# Patient Record
Sex: Male | Born: 1985 | Race: White | Hispanic: No | Marital: Married | Smoking: Former smoker
Health system: Southern US, Community
[De-identification: ages and names within clinical notes are randomized; demographics above are authoritative.]

## PROBLEM LIST (undated history)

## (undated) DIAGNOSIS — G43909 Migraine, unspecified, not intractable, without status migrainosus: Secondary | ICD-10-CM

## (undated) HISTORY — DX: Migraine, unspecified, not intractable, without status migrainosus: G43.909

## (undated) HISTORY — PX: OTHER SURGICAL HISTORY: SHX169

---

## 2016-06-28 DIAGNOSIS — Z720 Tobacco use: Secondary | ICD-10-CM | POA: Insufficient documentation

## 2016-06-28 DIAGNOSIS — F909 Attention-deficit hyperactivity disorder, unspecified type: Secondary | ICD-10-CM | POA: Insufficient documentation

## 2017-12-24 ENCOUNTER — Encounter: Payer: Self-pay | Admitting: Family Medicine

## 2017-12-24 ENCOUNTER — Ambulatory Visit (INDEPENDENT_AMBULATORY_CARE_PROVIDER_SITE_OTHER): Payer: BLUE CROSS/BLUE SHIELD | Admitting: Family Medicine

## 2017-12-24 DIAGNOSIS — F988 Other specified behavioral and emotional disorders with onset usually occurring in childhood and adolescence: Secondary | ICD-10-CM | POA: Diagnosis not present

## 2017-12-24 DIAGNOSIS — Z716 Tobacco abuse counseling: Secondary | ICD-10-CM

## 2017-12-24 NOTE — Progress Notes (Signed)
Subjective:    Patient ID: Russell Gutierrez, male    DOB: [birth year], 32 y.o.   MRN: [id number]  HPI  Patient presents to clinic to establish with new PCP.  Currently he takes Adderall 20 mg/day for ADD.  States he has used Adderall off and on for many years starting in his early teens to help with concentration.  States he usually does not take Adderall on the weekends, mainly uses it while at work.  He would like to consider decreasing his dose and/or eventually stopping Adderall to see how he does.  Currently he is taking Chantix 1 mg twice a day for smoking cessation.  He has been on Chantix now for a total of 12 weeks and has not been smoking any cigarettes recently.  He used Chantix a few years back, but stopped medication too soon and went back to smoking.  He would like to stay on Chantix for at least another 3 months to be sure its effects are long-lasting.  Patient is married and has a 156-week-old daughter named Eleni.  Reports no significant family Hx  Reports migraines in the past, but not any issues with headache in recent years.    Review of Systems  Constitutional: Negative for chills, fatigue and fever.  HENT: Negative for congestion, ear pain, sinus pain and sore throat.   Eyes: Negative.   Respiratory: Negative for cough, shortness of breath and wheezing.   Cardiovascular: Negative for chest pain, palpitations and leg swelling.  Gastrointestinal: Negative for abdominal pain, diarrhea, nausea and vomiting.  Genitourinary: Negative for dysuria, frequency and urgency.  Musculoskeletal: Negative for arthralgias and myalgias.  Skin: Negative for color change, pallor and rash.  Neurological: Negative for syncope, light-headedness and headaches.  Psychiatric/Behavioral: The patient is not nervous/anxious.       Objective:   Physical Exam  Constitutional: He is oriented to person, place, and time. He appears well-developed and well-nourished. No distress.  HENT:  Head:  Normocephalic and atraumatic.  Cardiovascular: Normal rate, regular rhythm and normal heart sounds.  No murmur heard. Pulmonary/Chest: Effort normal and breath sounds normal. No respiratory distress.  Neurological: He is alert and oriented to person, place, and time.  Skin: Skin is warm and dry. No pallor.  Psychiatric: He has a normal mood and affect. His behavior is normal. Thought content normal.  Nursing note and vitals reviewed.  Vitals:   12/24/17 0842  BP: 128/86  Pulse: 60  Temp: (!) 97.5 F (36.4 C)  SpO2: 98%      Assessment & Plan:   ADD - patient advised he can decrease Adderall dose to 10 mg/day, and he may continue to use at work as he has been doing and not use on weekends.  Patient advised that if Adderall helps him concentrate and feel productive at his job, there is no reason he has to come off completely.  A 10 or 20 mg dose is not a high dose, and he already does drug holidays on the weekends.  Patient advised that if he would like to see how he does without taking Adderall he is more than welcome to try this.  However more than willing to continue to prescribe him Adderall, so he can be productive at his job and in his life.  Smoking cessation -patient is doing very well on Chantix.  He will continue 1 mg twice daily dosing at this time.  We will keep patient on Chantix for a total goal of 6 months.  Follow up in 3-4 weeks for CPE

## 2017-12-24 NOTE — Patient Instructions (Signed)
Great to meet you! 

## 2018-01-08 ENCOUNTER — Telehealth: Payer: Self-pay | Admitting: Family Medicine

## 2018-01-08 DIAGNOSIS — F988 Other specified behavioral and emotional disorders with onset usually occurring in childhood and adolescence: Secondary | ICD-10-CM

## 2018-01-08 NOTE — Telephone Encounter (Signed)
Refill of adderall by historical provider  LOV 12/24/17 L. Guse   [id number] [address], [place] [phone]

## 2018-01-08 NOTE — Telephone Encounter (Signed)
Copied from CRM [id number]. Topic: Quick Communication - Rx Refill/Question >> Jan 08, 2018  4:46 PM Mickel Baas B, NT wrote: Medication: amphetamine-dextroamphetamine (ADDERALL) 10 MG tablet   Has the patient contacted their pharmacy? No. (Agent: If no, request that the patient contact the pharmacy for the refill.) (Agent: If yes, when and what did the pharmacy advise?)  Preferred Pharmacy (with phone number or street name): [id number] [place], [place] - [address]  Agent: Please be advised that RX refills may take up to 3 business days. We ask that you follow-up with your pharmacy.

## 2018-01-08 NOTE — Telephone Encounter (Signed)
rx request for adderall.

## 2018-01-09 MED ORDER — AMPHETAMINE-DEXTROAMPHETAMINE 10 MG PO TABS: 10.0000 mg | ORAL_TABLET | Freq: Two times a day (BID) | ORAL | 0 refills | Status: DC

## 2018-01-21 ENCOUNTER — Other Ambulatory Visit: Payer: Self-pay

## 2018-01-21 ENCOUNTER — Encounter: Payer: Self-pay | Admitting: Family Medicine

## 2018-01-21 ENCOUNTER — Ambulatory Visit (INDEPENDENT_AMBULATORY_CARE_PROVIDER_SITE_OTHER): Payer: BLUE CROSS/BLUE SHIELD | Admitting: Family Medicine

## 2018-01-21 VITALS — BP 114/76 | HR 61 | Temp 98.0°F | Wt 144.0 lb

## 2018-01-21 DIAGNOSIS — Z87891 Personal history of nicotine dependence: Secondary | ICD-10-CM | POA: Diagnosis not present

## 2018-01-21 DIAGNOSIS — Z8349 Family history of other endocrine, nutritional and metabolic diseases: Secondary | ICD-10-CM

## 2018-01-21 DIAGNOSIS — Z0000 Encounter for general adult medical examination without abnormal findings: Secondary | ICD-10-CM

## 2018-01-21 DIAGNOSIS — Z716 Tobacco abuse counseling: Secondary | ICD-10-CM | POA: Diagnosis not present

## 2018-01-21 DIAGNOSIS — R17 Unspecified jaundice: Secondary | ICD-10-CM

## 2018-01-21 DIAGNOSIS — F988 Other specified behavioral and emotional disorders with onset usually occurring in childhood and adolescence: Secondary | ICD-10-CM | POA: Diagnosis not present

## 2018-01-21 DIAGNOSIS — Z23 Encounter for immunization: Secondary | ICD-10-CM

## 2018-01-21 LAB — THYROID PANEL WITH TSH
FREE THYROXINE INDEX: 2.7 (ref 1.4–3.8)
T3 Uptake: 33 % (ref 22–35)
T4 TOTAL: 8.2 ug/dL (ref 4.9–10.5)
TSH: 2.12 mIU/L (ref 0.40–4.50)

## 2018-01-21 LAB — COMPREHENSIVE METABOLIC PANEL
ALT: 13 U/L (ref 0–53)
AST: 16 U/L (ref 0–37)
Albumin: 4.8 g/dL (ref 3.5–5.2)
Alkaline Phosphatase: 42 U/L (ref 39–117)
BILIRUBIN TOTAL: 3 mg/dL — AB (ref 0.2–1.2)
BUN: 21 mg/dL (ref 6–23)
CO2: 31 meq/L (ref 19–32)
CREATININE: 0.85 mg/dL (ref 0.40–1.50)
Calcium: 10 mg/dL (ref 8.4–10.5)
Chloride: 101 mEq/L (ref 96–112)
GFR: 110.17 mL/min (ref >60.00)
GLUCOSE: 85 mg/dL (ref 70–99)
Potassium: 3.8 mEq/L (ref 3.5–5.1)
Sodium: 137 mEq/L (ref 135–145)
Total Protein: 7.8 g/dL (ref 6.0–8.3)

## 2018-01-21 LAB — LIPID PANEL
CHOL/HDL RATIO: 3
Cholesterol: 132 mg/dL (ref 0–200)
HDL: 47.2 mg/dL (ref >39.00)
LDL Cholesterol: 72 mg/dL (ref 0–99)
NONHDL: 85.07
TRIGLYCERIDES: 63 mg/dL (ref 0.0–149.0)
VLDL: 12.6 mg/dL (ref 0.0–40.0)

## 2018-01-21 LAB — CBC
HCT: 45.7 % (ref 39.0–52.0)
Hemoglobin: 15.6 g/dL (ref 13.0–17.0)
MCHC: 34 g/dL (ref 30.0–36.0)
MCV: 88.5 fl (ref 78.0–100.0)
PLATELETS: 276 10*3/uL (ref 150.0–400.0)
RBC: 5.17 Mil/uL (ref 4.22–5.81)
RDW: 13.5 % (ref 11.5–15.5)
WBC: 4.3 10*3/uL (ref 4.0–10.5)

## 2018-01-21 NOTE — Progress Notes (Signed)
Subjective:    Patient ID: Russell Gutierrez, male    DOB: 01-Jul-1985, 32 y.o.   MRN: [id number]  HPI   Patient presents to clinic for annual physical exam.  Overall he is doing well without complaints.  He does want a flu shot today.  Patient's concentration is well controlled using Adderall 10 or 20 mg/day.  He does not use his Adderall on the weekends or when he goes away on vacation.  Mainly uses this for concentration at work.  Patient continues to take Chantix for smoking cessation.  States he still will smoke occasional cigarette, but is very pleased with how much chantix is helping.   Patient Active Problem List   Diagnosis Date Noted  . Attention deficit disorder (ADD) in adult 12/24/2017  . Encounter for smoking cessation counseling 12/24/2017   Social History   Tobacco Use  . Smoking status: Former Games developer  . Smokeless tobacco: Never Used  Substance Use Topics  . Alcohol use: Not on file   Review of Systems  Constitutional: Negative for chills, fatigue and fever.  HENT: Negative for congestion, ear pain, sinus pain and sore throat.   Eyes: Negative.   Respiratory: Negative for cough, shortness of breath and wheezing.   Cardiovascular: Negative for chest pain, palpitations and leg swelling.  Gastrointestinal: Negative for abdominal pain, diarrhea, nausea and vomiting.  Genitourinary: Negative for dysuria, frequency and urgency.  Musculoskeletal: Negative for arthralgias and myalgias.  Skin: Negative for color change, pallor and rash.  Neurological: Negative for syncope, light-headedness and headaches.  Psychiatric/Behavioral: The patient is not nervous/anxious.    Objective:   Physical Exam  Constitutional: He is oriented to person, place, and time. He appears well-developed and well-nourished. No distress.  HENT:  Head: Normocephalic and atraumatic.  Right Ear: External ear normal.  Left Ear: External ear normal.  Mouth/Throat: Oropharynx is clear and moist.  No oropharyngeal exudate.  Eyes: Pupils are equal, round, and reactive to light. Conjunctivae and EOM are normal. No scleral icterus.  Neck: Neck supple. No JVD present. No tracheal deviation present. No thyromegaly present.  Cardiovascular: Normal rate, regular rhythm, normal heart sounds and intact distal pulses.  No murmur heard. Abdominal: Soft. Bowel sounds are normal. He exhibits no distension. There is no tenderness. There is no guarding. Hernia confirmed negative in the right inguinal area and confirmed negative in the left inguinal area.  Genitourinary: Testes normal and penis normal. Right testis shows no mass, no swelling and no tenderness. Right testis is descended. Left testis shows no mass, no swelling and no tenderness. Left testis is descended.  Musculoskeletal: Normal range of motion. He exhibits no edema.  Lymphadenopathy: No inguinal adenopathy noted on the right or left side.  Neurological: He is alert and oriented to person, place, and time. No cranial nerve deficit. Coordination normal.  Skin: Skin is warm and dry. Capillary refill takes less than 2 seconds. No pallor.  Psychiatric: He has a normal mood and affect. His behavior is normal. Thought content normal.  Nursing note and vitals reviewed.   Vitals:   01/21/18 0839  BP: 114/76  Pulse: 61  Temp: 98 F (36.7 C)  SpO2: 98%      Assessment & Plan:   Well adult exam-patient will get lab work to check CBC, metabolic panel, thyroid, cholesterol.  Discussed healthy diet and regular exercise.  Also discussed importance of doing self testicular exam to monitor self for possibility of testicular cancer.  Smoking cessation/former smoker- patient will  continue Chantix throughout the end of the year to get full effect of medication.  Patient has done very well with his smoking cessation progress.  ADHD- concentration is very well controlled on Adderall.  Patient uses this medication appropriately to help concentration at  work, and takes a drug holiday from medication on weekends and on family vacations.  Flu vaccine given in clinic today.  Patient will follow-up in approximately 3 months for recheck on 20 cessation progress and ADHD.  Patient aware to call office when refills are required of his medications.

## 2018-01-22 NOTE — Addendum Note (Signed)
Addended by: Leanora CoverGUSE, Babbette Dalesandro on: 01/22/2018 01:26 PM   Modules accepted: Orders

## 2018-02-06 ENCOUNTER — Other Ambulatory Visit (INDEPENDENT_AMBULATORY_CARE_PROVIDER_SITE_OTHER): Payer: BLUE CROSS/BLUE SHIELD

## 2018-02-06 DIAGNOSIS — R17 Unspecified jaundice: Secondary | ICD-10-CM

## 2018-02-06 NOTE — Addendum Note (Signed)
Addended by: Penne Lash on: 02/06/2018 08:37 AM   Modules accepted: Orders

## 2018-02-07 LAB — HEPATITIS PANEL, ACUTE
HEP A IGM: NONREACTIVE
HEP B C IGM: NONREACTIVE
HEP B S AG: NONREACTIVE
HEP C AB: NONREACTIVE
SIGNAL TO CUT-OFF: 0.06 (ref <1.00)

## 2018-02-07 LAB — HEPATIC FUNCTION PANEL
AG RATIO: 1.7 (calc) (ref 1.0–2.5)
ALT: 12 U/L (ref 9–46)
AST: 16 U/L (ref 10–40)
Albumin: 4.7 g/dL (ref 3.6–5.1)
Alkaline phosphatase (APISO): 43 U/L (ref 40–115)
BILIRUBIN INDIRECT: 2.3 mg/dL — AB (ref 0.2–1.2)
Bilirubin, Direct: 0.5 mg/dL — ABNORMAL HIGH (ref 0.0–0.2)
GLOBULIN: 2.7 g/dL (ref 1.9–3.7)
TOTAL PROTEIN: 7.4 g/dL (ref 6.1–8.1)
Total Bilirubin: 2.8 mg/dL — ABNORMAL HIGH (ref 0.2–1.2)

## 2018-02-10 NOTE — Addendum Note (Signed)
Addended by: Leanora Cover on: 02/10/2018 01:29 PM   Modules accepted: Orders

## 2018-02-10 NOTE — Progress Notes (Signed)
Liver US order due to elevated bilirubin

## 2018-02-11 ENCOUNTER — Telehealth: Payer: Self-pay | Admitting: Family Medicine

## 2018-02-11 NOTE — Telephone Encounter (Signed)
Pt's wife Ladona Ridgel, on the [place]) is calling regarding explanation of her husband's labs. She can see the results but wants clarification on why he needs to have a liver US. They are worried that it may be cancer. He has a sister that has Sullivan Lone Syndrome and had to go thru the same thing last year with the elevated labs. She did not have to have the [place] and they wonder why he needs it.  The wife would like to be call back today please.

## 2018-02-11 NOTE — Telephone Encounter (Signed)
Pt's wife iis calling regarding explanation of her husband's labs. She can see the results but wants clarification on why he needs to have a liver US. They are worried that it may be cancer. He has a sister that has Sullivan Lone Syndrome and had to go thru the same thing last year with the elevated labs. She did not have to have the [place] and they wonder why he needs it.  The wife would like to be called back with explanation and results on his labs

## 2018-02-11 NOTE — Telephone Encounter (Signed)
Pt wife called in again with concerns about lab work.  They are hoping to get a call back today.  They are concerned about results   Best number  [phone]

## 2018-02-12 NOTE — Telephone Encounter (Signed)
Thank you :)

## 2018-02-12 NOTE — Telephone Encounter (Signed)
Talked with patient's wife and patient explained that the [place] to find out why patient bilirubin level is elevated , patient concerned that suspecthe  Could have something like liver cancer , advised we want [place] to determine why Bilirubin levels are high and to rule out causes.

## 2018-02-23 ENCOUNTER — Encounter: Payer: Self-pay | Admitting: Family Medicine

## 2018-02-23 ENCOUNTER — Ambulatory Visit (INDEPENDENT_AMBULATORY_CARE_PROVIDER_SITE_OTHER): Payer: BLUE CROSS/BLUE SHIELD | Admitting: Family Medicine

## 2018-02-23 VITALS — BP 114/62 | HR 75 | Temp 97.5°F | Ht 71.0 in | Wt 150.4 lb

## 2018-02-23 DIAGNOSIS — F988 Other specified behavioral and emotional disorders with onset usually occurring in childhood and adolescence: Secondary | ICD-10-CM

## 2018-02-23 DIAGNOSIS — Z716 Tobacco abuse counseling: Secondary | ICD-10-CM

## 2018-02-23 DIAGNOSIS — R17 Unspecified jaundice: Secondary | ICD-10-CM | POA: Diagnosis not present

## 2018-02-23 MED ORDER — AMPHETAMINE-DEXTROAMPHETAMINE 10 MG PO TABS: 10.0000 mg | ORAL_TABLET | Freq: Two times a day (BID) | ORAL | 0 refills | Status: DC

## 2018-02-23 NOTE — Progress Notes (Signed)
Subjective:    Patient ID: Russell Gutierrez, male    DOB: July 09, 1985, 32 y.o.   MRN: [id number]  HPI   Patient presents to clinic for follow-up on ADHD, smoking cessation and also to discuss elevated bilirubin levels.   Patient's concentration is well controlled with use of Adderall, usually takes a break from his medication on the weekends when he is not working.  Patient feels with this medication he is able to get all of his work tasks done in a timely manner.  Patient continues Chantix and is working on smoking cessation.  At his max he was smoking 1 pack/day, now he is smoking 1 pack/week.  States he will go 3 to 4 days without smoking, but then will have a day where he smokes a couple cigarettes.  Patient's last 2 lab work results reveal elevated bilirubin levels.  Patient's hepatitis panel was negative for any hepatitis.  Patient states his sister was diagnosed with Gilbert's syndrome during 1 of her pregnancies, and he wonders if he could also have this as well.  Patient Active Problem List   Diagnosis Date Noted  . Attention deficit disorder (ADD) in adult 12/24/2017  . Encounter for smoking cessation counseling 12/24/2017   Social History   Tobacco Use  . Smoking status: Former Games developer  . Smokeless tobacco: Never Used  Substance Use Topics  . Alcohol use: Not on file   Review of Systems   Constitutional: Negative for chills, fatigue and fever.  HENT: Negative for congestion, ear pain, sinus pain and sore throat.   Eyes: Negative.   Respiratory: Negative for cough, shortness of breath and wheezing.   Cardiovascular: Negative for chest pain, palpitations and leg swelling.  Gastrointestinal: Negative for abdominal pain, diarrhea, nausea and vomiting.  Genitourinary: Negative for dysuria, frequency and urgency.  Musculoskeletal: Negative for arthralgias and myalgias.  Skin: Negative for color change, pallor and rash.  Neurological: Negative for syncope, light-headedness  and headaches.  Psychiatric/Behavioral: The patient is not nervous/anxious.    Objective:   Physical Exam   Constitutional: He appears well-developed and well-nourished. No distress.  Head: Normocephalic and atraumatic.  Eyes: Conjunctivae and EOM are normal. No scleral icterus.  Neck: Normal range of motion. Neck supple. No tracheal deviation present.  Cardiovascular: Normal rate, regular rhythm and normal heart sounds.  Pulmonary/Chest: Effort normal and breath sounds normal. No respiratory distress. Abdominal: Soft. Bowel sounds are normal. There is no tenderness.  Neurological: He is alert and oriented to person, place, and time. Gait normal  Skin: Skin is warm and dry. He is not diaphoretic. No pallor.  Psychiatric: He has a normal mood and affect. His behavior is normal. Thought content normal.   Nursing note and vitals reviewed.    Vitals:   02/23/18 0907  BP: 114/62  Pulse: 75  Temp: (!) 97.5 F (36.4 C)  SpO2: 99%   Assessment & Plan:   ADHD - Adderall refill given.  Patient is doing very well on this medication dose.  He will continue to keep lists, calendars to help keep himself on track.  [place] PMP narcotic registry checked and is appropriate for refill  Smoking cessation - patient is doing well with weaning down on cigarette smoking.  He will continue Chantix for at least another 2 months ([facility]  Patient advised to try different things like may be having toothpick in mouth or having a lollipop to help combat that need for cigarette.  Also  suggest that he can try nicotine gum to help combat cigarette cravings.  Patient's goal is to be completely done with smoking by Thanksgiving.  Elevated bilirubin - we will get ultrasound of liver.  Once we have ultrasound results, we will determine if need for GI referral is necessary.  It is possible that patient also has the Gilbert's syndrome since his sister does.  Keep already scheduled follow-up  in December 2019 as planned.  Return to clinic sooner if any issues arise.

## 2018-02-23 NOTE — Patient Instructions (Addendum)
Liver Ultrasound upcoming to evaluate anatomy of liver

## 2018-02-26 ENCOUNTER — Ambulatory Visit
Admission: RE | Admit: 2018-02-26 | Discharge: 2018-02-26 | Disposition: A | Discharge status: Home / Self Care | Payer: BLUE CROSS/BLUE SHIELD | Source: Ambulatory Visit | Attending: Family Medicine | Admitting: Family Medicine

## 2018-02-26 DIAGNOSIS — R17 Unspecified jaundice: Secondary | ICD-10-CM | POA: Diagnosis not present

## 2018-03-02 NOTE — Addendum Note (Signed)
Addended by: Leanora Cover on: 03/02/2018 04:57 PM   Modules accepted: Orders

## 2018-03-13 ENCOUNTER — Encounter: Payer: Self-pay | Admitting: *Deleted

## 2018-04-14 ENCOUNTER — Other Ambulatory Visit: Payer: Self-pay | Admitting: Family Medicine

## 2018-04-14 DIAGNOSIS — F988 Other specified behavioral and emotional disorders with onset usually occurring in childhood and adolescence: Secondary | ICD-10-CM

## 2018-04-14 NOTE — Telephone Encounter (Signed)
Copied from CRM [id number]. Topic: General - Other >> Apr 14, 2018 12:52 PM Leafy Roobinson, Norma J wrote: Reason for CRM: pt is calling and needs generic adderall 10 mg. Pt has [facility] [address]

## 2018-04-15 MED ORDER — AMPHETAMINE-DEXTROAMPHETAMINE 10 MG PO TABS: 10.0000 mg | ORAL_TABLET | Freq: Two times a day (BID) | ORAL | 0 refills | Status: DC

## 2018-04-15 NOTE — Telephone Encounter (Signed)
PMP registry checked and is ok for refill

## 2018-04-15 NOTE — Telephone Encounter (Signed)
Pt called [facility] Reason for CRM: pt is calling and needs generic adderall 10 mg. Pt has contact [facility] pharm [facility] [address]

## 2018-04-22 ENCOUNTER — Ambulatory Visit: Payer: BLUE CROSS/BLUE SHIELD | Admitting: Family Medicine

## 2018-04-22 DIAGNOSIS — Z0289 Encounter for other administrative examinations: Secondary | ICD-10-CM

## 2018-04-22 NOTE — Progress Notes (Deleted)
   Subjective:    Patient ID: Russell Gutierrez, male    DOB: [birth year], 32 y.o.   MRN: [id number]  HPI  Presents to clinic for 3 month follow up on smoking cessation while taking chantix  Patient Active Problem List   Diagnosis Date Noted  . Attention deficit disorder (ADD) in adult 12/24/2017  . Encounter for smoking cessation counseling 12/24/2017   Social History   Tobacco Use  . Smoking status: Former Games developermoker  . Smokeless tobacco: Never Used  Substance Use Topics  . Alcohol use: Not on file   Review of Systems   Constitutional: Negative for chills, fatigue and fever.  HENT: Negative for congestion, ear pain, sinus pain and sore throat.   Eyes: Negative.   Respiratory: Negative for cough, shortness of breath and wheezing.   Cardiovascular: Negative for chest pain, palpitations and leg swelling.  Gastrointestinal: Negative for abdominal pain, diarrhea, nausea and vomiting.  Genitourinary: Negative for dysuria, frequency and urgency.  Musculoskeletal: Negative for arthralgias and myalgias.  Skin: Negative for color change, pallor and rash.  Neurological: Negative for syncope, light-headedness and headaches.  Psychiatric/Behavioral: The patient is not nervous/anxious.       Objective:   Physical Exam        Assessment & Plan:

## 2018-05-14 ENCOUNTER — Other Ambulatory Visit: Payer: Self-pay | Admitting: Family Medicine

## 2018-05-14 MED ORDER — VARENICLINE TARTRATE 1 MG PO TABS: 1.0000 mg | ORAL_TABLET | Freq: Two times a day (BID) | ORAL | 1 refills | Status: DC

## 2018-05-14 NOTE — Telephone Encounter (Signed)
Copied from CRM [id number]. Topic: Quick Communication - Rx Refill/Question >> May 14, 2018 10:16 AM Crist Infante wrote: Medication: varenicline (CHANTIX) 1 MG tablet  Wife states they are traveling, in [place] right now, and pt has run out of this med.  She states he is doing so good at not smoking, she hopes this can be refilled. [facility] [place], and have transferred if you can send to the local [facility]. [id number] [address] [address], [place] - [address] [phone] (Phone) [phone] (Fax)

## 2018-05-14 NOTE — Telephone Encounter (Signed)
Requested medication (s) are due for refill today: yes   Requested medication (s) are on the active medication list: yes - historical med  Last refill:  12/24/17   Future visit scheduled: yes  Notes to clinic:  Historical provider   Requested Prescriptions  Pending Prescriptions Disp Refills   varenicline (CHANTIX) 1 MG tablet      Sig: Take 1 tablet (1 mg total) by mouth 2 (two) times daily.     Psychiatry:  Drug Dependence Therapy Passed - 05/14/2018 10:32 AM      Passed - Valid encounter within last 12 months    Recent Outpatient Visits          2 months ago Attention deficit disorder (ADD) in adult   [facility] [place] Guse, Janna Arch, FNP   3 months ago Former cigarette smoker   [facility] [place] Guse, Janna Arch, FNP   4 months ago Attention deficit disorder (ADD) in adult   [facility] [place] Guse, Janna Arch, FNP      Future Appointments            In 1 week Guse, Janna Arch, FNP [place] [facility] [place], [facility]

## 2018-05-26 ENCOUNTER — Ambulatory Visit: Payer: BLUE CROSS/BLUE SHIELD | Admitting: Family Medicine

## 2018-05-29 ENCOUNTER — Ambulatory Visit (INDEPENDENT_AMBULATORY_CARE_PROVIDER_SITE_OTHER): Payer: BLUE CROSS/BLUE SHIELD | Admitting: Family Medicine

## 2018-05-29 ENCOUNTER — Encounter: Payer: Self-pay | Admitting: Family Medicine

## 2018-05-29 VITALS — BP 110/68 | HR 73 | Temp 98.1°F | Resp 16 | Ht 71.0 in | Wt 153.0 lb

## 2018-05-29 DIAGNOSIS — F988 Other specified behavioral and emotional disorders with onset usually occurring in childhood and adolescence: Secondary | ICD-10-CM | POA: Diagnosis not present

## 2018-05-29 DIAGNOSIS — Z716 Tobacco abuse counseling: Secondary | ICD-10-CM | POA: Diagnosis not present

## 2018-05-29 MED ORDER — VARENICLINE TARTRATE 1 MG PO TABS: 1.0000 mg | ORAL_TABLET | Freq: Two times a day (BID) | ORAL | 1 refills | Status: DC

## 2018-05-29 MED ORDER — AMPHETAMINE-DEXTROAMPHETAMINE 10 MG PO TABS: 10.0000 mg | ORAL_TABLET | Freq: Two times a day (BID) | ORAL | 0 refills | Status: DC

## 2018-05-29 NOTE — Patient Instructions (Addendum)
Awesome job with quitting smoking!!  Take chantix for 8 more weeks, then can stop taking

## 2018-05-29 NOTE — Progress Notes (Signed)
   Subjective:    Patient ID: Russell Gutierrez, male    DOB: 09/03/1985, 33 y.o.   MRN: [id number]  HPI   Patient presents to clinic to follow-up on ADD and also speaking cessation.  Patient feels well on his Adderall dose.  He uses it mainly during the workweek and usually does not take on the weekends.  Feels he is able to accomplish all the tasks he need to do at work and also be focused on his family when he is home.  Denies any palpitations, chest pain or shortness of breath symptoms with the medication.  Patient has not smoked any cigarettes since January 1.  Around Christmas time, patient was smoking a couple of cigarettes a week, but without even realizing it he did not end up buying another pack and has not smoked in 3 weeks.  Patient is taking Chantix 1 mg twice daily and is very grateful that this medication has helped him to slowly wean down and quit smoking completely.  Patient Active Problem List   Diagnosis Date Noted  . Attention deficit disorder (ADD) in adult 12/24/2017  . Encounter for smoking cessation counseling 12/24/2017   Social History   Tobacco Use  . Smoking status: Former Games developer  . Smokeless tobacco: Never Used  Substance Use Topics  . Alcohol use: Not on file    Review of Systems  Constitutional: Negative for chills, fatigue and fever.  HENT: Negative for congestion, ear pain, sinus pain and sore throat.   Eyes: Negative.   Respiratory: Negative for cough, shortness of breath and wheezing.   Cardiovascular: Negative for chest pain, palpitations and leg swelling.  Gastrointestinal: Negative for abdominal pain, diarrhea, nausea and vomiting.  Genitourinary: Negative for dysuria, frequency and urgency.  Musculoskeletal: Negative for arthralgias and myalgias.  Skin: Negative for color change, pallor and rash.  Neurological: Negative for syncope, light-headedness and headaches.  Psychiatric/Behavioral: The patient is not nervous/anxious.         Objective:   Physical Exam  Constitutional: He appears well-developed and well-nourished. No distress.  HENT:  Head: Normocephalic and atraumatic.  Eyes: Pupils are equal, round, and reactive to light. Conjunctivae and EOM are normal. No scleral icterus.  Neck: Normal range of motion. Neck supple. No tracheal deviation present.  Cardiovascular: Normal rate, regular rhythm and normal heart sounds.  Pulmonary/Chest: Effort normal and breath sounds normal. No respiratory distress. He has no wheezes. He has no rales.  Neurological: He is alert and oriented to person, place, and time.  Gait normal  Skin: Skin is warm and dry. He is not diaphoretic. No pallor.  Psychiatric: He has a normal mood and affect. His behavior is normal. Thought content normal.   Nursing note and vitals reviewed.    Vitals:   05/29/18 0844  BP: 110/68  Pulse: 73  Resp: 16  Temp: 98.1 F (36.7 C)  SpO2: 99%      Assessment & Plan:   ADD-patient states concentration is well controlled using Adderall.  [facility] PMP registry checked and is appropriate for refill of Adderall at this time.  Smoking cessation counseling-patient has not smoked a cigarette in 3 weeks.  He will continue taking the Chantix for another 8 weeks to be sure the medication is effective to help him be smoke-free for the long-term.  Patient will follow-up here in approximately 3 months for recheck of ADD and also smoking cessation status.

## 2018-06-08 ENCOUNTER — Other Ambulatory Visit: Payer: Self-pay | Admitting: Family Medicine

## 2018-06-08 ENCOUNTER — Telehealth: Payer: Self-pay | Admitting: Family Medicine

## 2018-06-08 ENCOUNTER — Ambulatory Visit: Payer: Self-pay

## 2018-06-08 ENCOUNTER — Encounter: Payer: Self-pay | Admitting: Family Medicine

## 2018-06-08 ENCOUNTER — Ambulatory Visit (INDEPENDENT_AMBULATORY_CARE_PROVIDER_SITE_OTHER): Payer: BLUE CROSS/BLUE SHIELD | Admitting: Family Medicine

## 2018-06-08 VITALS — BP 120/84 | HR 83 | Temp 98.6°F | Resp 18 | Wt 151.5 lb

## 2018-06-08 DIAGNOSIS — Z716 Tobacco abuse counseling: Secondary | ICD-10-CM

## 2018-06-08 DIAGNOSIS — R509 Fever, unspecified: Secondary | ICD-10-CM | POA: Diagnosis not present

## 2018-06-08 DIAGNOSIS — J101 Influenza due to other identified influenza virus with other respiratory manifestations: Secondary | ICD-10-CM | POA: Diagnosis not present

## 2018-06-08 MED ORDER — ONDANSETRON 4 MG PO TBDP: 4.0000 mg | ORAL_TABLET | Freq: Three times a day (TID) | ORAL | 1 refills | Status: DC | PRN

## 2018-06-08 MED ORDER — OSELTAMIVIR PHOSPHATE 75 MG PO CAPS: 75.0000 mg | ORAL_CAPSULE | Freq: Two times a day (BID) | ORAL | 0 refills | Status: DC

## 2018-06-08 NOTE — Progress Notes (Signed)
Subjective:    Patient ID: Russell Gutierrez, male    DOB: March 17, 1986, 33 y.o.   MRN: [id number]  HPI  Patient presents to clinic complaining of fever, body aches and chills for the past 1-1/2 days.  Patient states his fever was 101.8 at its highest yesterday.  Patient has been try to keep fever down by taking Tylenol and ibuprofen.  States he spent the weekend with 2 of his nephews, who he just found out were diagnosed with the flu.  Patient Active Problem List   Diagnosis Date Noted  . Attention deficit disorder (ADD) in adult 12/24/2017  . Encounter for smoking cessation counseling 12/24/2017   Social History   Tobacco Use  . Smoking status: Former Games developer  . Smokeless tobacco: Never Used  Substance Use Topics  . Alcohol use: Not on file    Review of Systems   Constitutional: +chills, fatigue and fever.  HENT: Negative for congestion, ear pain, sinus pain and sore throat.   Eyes: Negative.   Respiratory: Negative for cough, shortness of breath and wheezing.   Cardiovascular: Negative for chest pain, palpitations and leg swelling.  Gastrointestinal: Some nausea. Negative for abdominal pain, diarrhea, and vomiting.  Genitourinary: Negative for dysuria, frequency and urgency.  Musculoskeletal: +body aches.  Skin: Negative for color change, pallor and rash.  Neurological: Negative for syncope, light-headedness and headaches.  Psychiatric/Behavioral: The patient is not nervous/anxious.       Objective:   Physical Exam Vitals signs and nursing note reviewed.  Constitutional:      General: He is not in acute distress.    Appearance: He is not toxic-appearing.  HENT:     Head: Normocephalic and atraumatic.     Nose: Congestion and rhinorrhea present.     Mouth/Throat:     Mouth: Mucous membranes are moist.     Pharynx: No oropharyngeal exudate or posterior oropharyngeal erythema.  Eyes:     General: No scleral icterus.    Extraocular Movements: Extraocular movements  intact.     Conjunctiva/sclera: Conjunctivae normal.  Cardiovascular:     Rate and Rhythm: Normal rate and regular rhythm.  Pulmonary:     Effort: Pulmonary effort is normal. No respiratory distress.     Breath sounds: Normal breath sounds. No wheezing, rhonchi or rales.  Abdominal:     General: Abdomen is flat. Bowel sounds are normal.     Palpations: Abdomen is soft.  Skin:    General: Skin is warm and dry.     Coloration: Skin is not jaundiced or pale.  Neurological:     Mental Status: He is alert and oriented to person, place, and time.  Psychiatric:        Mood and Affect: Mood normal.        Behavior: Behavior normal.    Vitals:   06/08/18 1312  BP: 120/84  Pulse: 83  Resp: 18  Temp: 98.6 F (37 C)  SpO2: 98%      Assessment & Plan:   Influenza A- patient's point-of-care flu testing is positive for flu A.  This explains all patient's symptoms.  Advised to get plenty of rest, do good handwashing and keep up good fluid intake.  Advised to alternate Tylenol and Motrin to keep fever down and treat body aches.  Advised patient he is considered contagious from 5 to 7 days of symptom onset so he should remain home as much as possible during this period of time.  Out of work note given.  Patient will keep regularly scheduled follow-up as planned.  Advised to return to clinic sooner if new issues arise or if current symptoms do not improve as expected.

## 2018-06-08 NOTE — Patient Instructions (Signed)

## 2018-06-08 NOTE — Telephone Encounter (Signed)
Incoming call from Patient's wife, who states that at the office her husband temp. Ws 101.4 .  It now is 103.6.  Telephone call to office.  Leanora Cover  FNP gave verbal order for Patient to alternate Ibuprofen and Tyleno.  Patients wife voiced understanding.

## 2018-06-09 LAB — POCT INFLUENZA A/B
Influenza A, POC: POSITIVE — AB
Influenza B, POC: NEGATIVE

## 2018-07-20 ENCOUNTER — Other Ambulatory Visit: Payer: Self-pay | Admitting: Family Medicine

## 2018-07-20 DIAGNOSIS — F988 Other specified behavioral and emotional disorders with onset usually occurring in childhood and adolescence: Secondary | ICD-10-CM

## 2018-07-20 NOTE — Telephone Encounter (Signed)
Requested medication (s) are due for refill today: yes  Requested medication (s) are on the active medication list: yes    Last refill: 05/29/2018 #60  0 refills  Future visit scheduled no  Notes to clinic:not delegated  Requested Prescriptions  Pending Prescriptions Disp Refills   amphetamine-dextroamphetamine (ADDERALL) 10 MG tablet 60 tablet 0    Sig: Take 1 tablet (10 mg total) by mouth 2 (two) times daily.     Not Delegated - Psychiatry:  Stimulants/ADHD Failed - 07/20/2018 12:30 PM      Failed - This refill cannot be delegated      Failed - Urine Drug Screen completed in last 360 days.      Passed - Valid encounter within last 3 months    Recent Outpatient Visits          1 month ago Influenza A   [facility] [place] Guse, Janna Arch, FNP   1 month ago Encounter for smoking cessation counseling   [facility] Guse, Janna Arch, FNP   4 months ago Attention deficit disorder (ADD) in adult   [facility] [place] Guse, Janna Arch, FNP   6 months ago Former cigarette smoker   [facility] Guse, Janna Arch, FNP   6 months ago Attention deficit disorder (ADD) in adult   [facility] [place] Guse, Janna Arch, FNP

## 2018-07-20 NOTE — Telephone Encounter (Signed)
Copied from CRM [id number]. Topic: Quick Communication - Rx Refill/Question >> Jul 20, 2018 11:17 AM Jaquita Rector A wrote: Medication: amphetamine-dextroamphetamine (ADDERALL) 10 MG tablet  Has the patient contacted their pharmacy? Yes.   (Agent: If no, request that the patient contact the pharmacy for the refill.) (Agent: If yes, when and what did the pharmacy advise?)  Preferred Pharmacy (with phone number or street name): [id number] [place], [place] - [address] [phone] (Phone) [phone] (Fax)    Agent: Please be advised that RX refills may take up to 3 business days. We ask that you follow-up with your pharmacy.

## 2018-07-21 MED ORDER — AMPHETAMINE-DEXTROAMPHETAMINE 10 MG PO TABS: 10.0000 mg | ORAL_TABLET | Freq: Two times a day (BID) | ORAL | 0 refills | Status: DC

## 2018-08-25 ENCOUNTER — Other Ambulatory Visit: Payer: Self-pay | Admitting: Family Medicine

## 2018-08-25 DIAGNOSIS — F988 Other specified behavioral and emotional disorders with onset usually occurring in childhood and adolescence: Secondary | ICD-10-CM

## 2018-08-25 MED ORDER — AMPHETAMINE-DEXTROAMPHETAMINE 10 MG PO TABS: 10.0000 mg | ORAL_TABLET | Freq: Two times a day (BID) | ORAL | 0 refills | Status: DC

## 2018-08-25 NOTE — Telephone Encounter (Signed)
Requested medication (s) are due for refill today: yes  Requested medication (s) are on the active medication list: yes  Last refill:  07/21/18  Future visit scheduled: no  Notes to clinic:  Medication not delegated to NT to refill   Requested Prescriptions  Pending Prescriptions Disp Refills   amphetamine-dextroamphetamine (ADDERALL) 10 MG tablet 60 tablet 0    Sig: Take 1 tablet (10 mg total) by mouth 2 (two) times daily.     Not Delegated - Psychiatry:  Stimulants/ADHD Failed - 08/25/2018 10:39 AM      Failed - This refill cannot be delegated      Failed - Urine Drug Screen completed in last 360 days.      Passed - Valid encounter within last 3 months    Recent Outpatient Visits          2 months ago Influenza A   [facility] Guse, Janna Arch, FNP   2 months ago Encounter for smoking cessation counseling   [facility] Guse, Janna Arch, FNP   6 months ago Attention deficit disorder (ADD) in adult   [facility] [place] Guse, Janna Arch, FNP   7 months ago Former cigarette smoker   [employer] Guse, Janna Arch, FNP   8 months ago Attention deficit disorder (ADD) in adult   [facility] [place] Guse, Janna Arch, FNP

## 2018-10-06 ENCOUNTER — Other Ambulatory Visit: Payer: Self-pay | Admitting: Lab

## 2018-10-06 ENCOUNTER — Telehealth: Payer: Self-pay | Admitting: Family Medicine

## 2018-10-06 DIAGNOSIS — F988 Other specified behavioral and emotional disorders with onset usually occurring in childhood and adolescence: Secondary | ICD-10-CM

## 2018-10-06 MED ORDER — AMPHETAMINE-DEXTROAMPHETAMINE 10 MG PO TABS: 10.0000 mg | ORAL_TABLET | Freq: Two times a day (BID) | ORAL | 0 refills | Status: DC

## 2018-10-06 NOTE — Telephone Encounter (Signed)
Put in for Rx refill

## 2018-10-06 NOTE — Telephone Encounter (Unsigned)
Copied from CRM [id number]. Topic: Quick Communication - Rx Refill/Question >> Oct 06, 2018 11:25 AM Elliot Gault wrote: Medication: amphetamine-dextroamphetamine (ADDERALL) 10 MG tablet    Preferred Pharmacy (with phone number or street name):  [id number] [place], [place] - [address] [phone] (Phone) [phone] (Fax)    Agent: Please be advised that RX refills may take up to 3 business days. We ask that you follow-up with your pharmacy.

## 2018-11-09 ENCOUNTER — Telehealth: Payer: Self-pay | Admitting: Family Medicine

## 2018-11-09 DIAGNOSIS — F988 Other specified behavioral and emotional disorders with onset usually occurring in childhood and adolescence: Secondary | ICD-10-CM

## 2018-11-09 MED ORDER — AMPHETAMINE-DEXTROAMPHETAMINE 10 MG PO TABS: 10.0000 mg | ORAL_TABLET | Freq: Two times a day (BID) | ORAL | 0 refills | Status: DC

## 2018-11-09 NOTE — Telephone Encounter (Signed)
Last OV:  05/29/18  Last Refill:  10/06/18

## 2018-11-09 NOTE — Telephone Encounter (Signed)
done

## 2018-11-09 NOTE — Telephone Encounter (Signed)
Medication Refill - Medication: amphetamine-dextroamphetamine (ADDERALL) 10 MG tablet    Has the patient contacted their pharmacy? Yes.   (Agent: If no, request that the patient contact the pharmacy for the refill.) (Agent: If yes, when and what did the pharmacy advise?)  Preferred Pharmacy (with phone number or street name): [id number] Russell Gutierrez, [place]: Please be advised that RX refills may take up to 3 business days. We ask that you follow-up with your pharmacy.

## 2018-12-13 DIAGNOSIS — B001 Herpesviral vesicular dermatitis: Secondary | ICD-10-CM | POA: Diagnosis not present

## 2018-12-21 ENCOUNTER — Ambulatory Visit: Payer: Self-pay | Admitting: *Deleted

## 2018-12-21 ENCOUNTER — Other Ambulatory Visit: Payer: Self-pay | Admitting: Family Medicine

## 2018-12-21 ENCOUNTER — Other Ambulatory Visit: Payer: Self-pay | Admitting: Lab

## 2018-12-21 ENCOUNTER — Ambulatory Visit (INDEPENDENT_AMBULATORY_CARE_PROVIDER_SITE_OTHER): Payer: BC Managed Care – PPO | Admitting: Family Medicine

## 2018-12-21 ENCOUNTER — Other Ambulatory Visit: Payer: Self-pay

## 2018-12-21 DIAGNOSIS — B029 Zoster without complications: Secondary | ICD-10-CM | POA: Diagnosis not present

## 2018-12-21 DIAGNOSIS — F988 Other specified behavioral and emotional disorders with onset usually occurring in childhood and adolescence: Secondary | ICD-10-CM

## 2018-12-21 MED ORDER — LIDOCAINE 5 % EX OINT: 1.0000 "application " | TOPICAL_OINTMENT | CUTANEOUS | 2 refills | Status: DC | PRN

## 2018-12-21 MED ORDER — AMPHETAMINE-DEXTROAMPHETAMINE 10 MG PO TABS: 10.0000 mg | ORAL_TABLET | Freq: Two times a day (BID) | ORAL | 0 refills | Status: DC

## 2018-12-21 MED ORDER — GABAPENTIN 300 MG PO CAPS: 300.0000 mg | ORAL_CAPSULE | Freq: Every day | ORAL | 0 refills | Status: DC

## 2018-12-21 NOTE — Progress Notes (Signed)
Patient ID: Russell BergeronDavid V Gutierrez, male   DOB: Mar 01, 1986, 33 y.o.   MRN: [id number]    Virtual Visit via video Note  This visit type was conducted due to national recommendations for restrictions regarding the COVID-19 pandemic (e.g. social distancing).  This format is felt to be most appropriate for this patient at this time.  All issues noted in this document were discussed and addressed.  No physical exam was performed (except for noted visual exam findings with Video Visits).   I connected with Russell Gutierrez today at 10:20 AM EDT by a video enabled telemedicine application and verified that I am speaking with the correct person using two identifiers. Location patient: home Location provider: work or home office Persons participating in the virtual visit: patient, provider  I discussed the limitations, risks, security and privacy concerns of performing an evaluation and management service by telephone and the availability of in person appointments. I also discussed with the patient that there may be a patient responsible charge related to this service. The patient expressed understanding and agreed to proceed.   HPI:  Patient and I connected via video to discuss shingles pain.  He was diagnosed with shingles last week at a urgent care and has finished the weeklong prescription for Valtrex.  Still having some nerve pain along his back where the rash once was.  States the rash is pretty much dried up and has some redness.  Notices the pain and itching especially at night when trying to lay down to go to sleep.  Denies fever or chills.  ROS: See pertinent positives and negatives per HPI.  Past Medical History:  Diagnosis Date  . Migraines     Past Surgical History:  Procedure Laterality Date  . broken arm     age 33     Current Outpatient Medications:  .  amphetamine-dextroamphetamine (ADDERALL) 10 MG tablet, Take 1 tablet (10 mg total) by mouth 2 (two) times daily., Disp: 60 tablet,  Rfl: 0 .  CHANTIX 1 MG tablet, TAKE 1 TABLET BY MOUTH TWICE A DAY, Disp: 60 tablet, Rfl: 1 .  ondansetron (ZOFRAN ODT) 4 MG disintegrating tablet, Take 1 tablet (4 mg total) by mouth every 8 (eight) hours as needed for nausea or vomiting., Disp: 20 tablet, Rfl: 1 .  oseltamivir (TAMIFLU) 75 MG capsule, Take 1 capsule (75 mg total) by mouth 2 (two) times daily., Disp: 10 capsule, Rfl: 0 .  gabapentin (NEURONTIN) 300 MG capsule, Take 1 capsule (300 mg total) by mouth at bedtime. You may stop taking if the shingles pain improves., Disp: 90 capsule, Rfl: 0 .  lidocaine (XYLOCAINE) 5 % ointment, Apply 1 application topically as needed. Use every 2-3 hours as needed on affected area for pain relief., Disp: 35.44 g, Rfl: 2  EXAM:   GENERAL: alert, oriented, appears well and in no acute distress  HEENT: atraumatic, conjunttiva clear, no obvious abnormalities on inspection of external nose and ears  NECK: normal movements of the head and neck  LUNGS: on inspection no signs of respiratory distress, breathing rate appears normal, no obvious gross SOB, gasping or wheezing  CV: no obvious cyanosis  MS: moves all visible extremities without noticeable abnormality  PSYCH/NEURO: pleasant and cooperative, no obvious depression or anxiety, speech and thought processing grossly intact  ASSESSMENT AND PLAN:  Discussed the following assessment and plan:  Shingles-patient will take gabapentin at bedtime for the next couple of weeks to help reduce nerve pain.  He will also use lidocaine  ointment as needed on painful rash area.  Advised when the pain begins to subside he can stop gabapentin and monitor how he does.  Advised he is on the very low dose, so he does not have to wean down.   I discussed the assessment and treatment plan with the patient. The patient was provided an opportunity to ask questions and all were answered. The patient agreed with the plan and demonstrated an understanding of the  instructions.   The patient was advised to call back or seek an in-person evaluation if the symptoms worsen or if the condition fails to improve as anticipated.   Jodelle Green, FNP

## 2018-12-21 NOTE — Telephone Encounter (Signed)
Medication: amphetamine-dextroamphetamine (ADDERALL) 10 MG tablet  Has the patient contacted their pharmacy? Yes (Agent: If no, request that the patient contact the pharmacy for the refill.) (Agent: If yes, when and what did the pharmacy advise?)  Preferred Pharmacy (with phone number or street name): [id number] [place], [place]           [phone] (Phone) [phone] (Fax)

## 2018-12-21 NOTE — Telephone Encounter (Signed)
All med refill requests need to be made into Rx requests with the orders pended  

## 2018-12-21 NOTE — Telephone Encounter (Signed)
Copied from [place] [id number]. Topic: Quick Communication - Rx Refill/Question >> Dec 21, 2018  8:45 AM Leward Quan A wrote: Medication: amphetamine-dextroamphetamine (ADDERALL) 10 MG tablet  Has the patient contacted their pharmacy? Yes (Agent: If no, request that the patient contact the pharmacy for the refill.) (Agent: If yes, when and what did the pharmacy advise?)  Preferred Pharmacy (with phone number or street name): [id number] [address], [place] [phone] (Phone) [phone] (Fax)    Agent: Please be advised that RX refills may take up to 3 business days. We ask that you follow-up with your pharmacy.

## 2018-12-21 NOTE — Telephone Encounter (Signed)
[facility]. Completed valtrex yesterday. Took ibuprofen for pain but not helping. Rash at top right shoulder and under arm starting to crust over. Rates the nerve pain at 6-7. No fever.No travels/no exposures. Transferring for appointment.  Reason for Disposition . Postherpetic neuralgia, questions about  Answer Assessment - Initial Assessment Questions 1. APPEARANCE of RASH: "Describe the rash."     Small blisters 2. LOCATION: "Where is the rash located?"     Top right shoulder and underarm 3. ONSET: "When did the rash start?"      one week ago   4. ITCHING: "Does the rash itch?" If so, ask: "How bad is the itch?"  (Scale 1-10; or mild, moderate, severe)    Nerve pain intense. 5. PAIN: "Does the rash hurt?" If so, ask: "How bad is the pain?"  (Scale 1-10; or mild, moderate, severe)    Rash still itches 6. OTHER SYMPTOMS: "Do you have any other symptoms?" (e.g., fever)     no 7. PREGNANCY: "Is there any chance you are pregnant?" "When was your last menstrual period?"     na  Protocols used: [facility]

## 2018-12-21 NOTE — Addendum Note (Signed)
Addended by: Nanci Pina on: 12/21/2018 11:38 AM   Modules accepted: Orders

## 2018-12-21 NOTE — Telephone Encounter (Signed)
Please convert to rx request with order pended

## 2018-12-22 MED ORDER — AMPHETAMINE-DEXTROAMPHETAMINE 10 MG PO TABS: 10.0000 mg | ORAL_TABLET | Freq: Two times a day (BID) | ORAL | 0 refills | Status: DC

## 2018-12-22 NOTE — Telephone Encounter (Signed)
Patient's wife called in stating pharmacy is currently out of medication and is [facility] [address] phone is [phone]. Please advise. Pt is completely out.

## 2018-12-22 NOTE — Telephone Encounter (Signed)
Sent to [address]

## 2019-02-04 ENCOUNTER — Telehealth: Payer: Self-pay | Admitting: Family Medicine

## 2019-02-04 ENCOUNTER — Other Ambulatory Visit: Payer: Self-pay | Admitting: Lab

## 2019-02-04 DIAGNOSIS — F988 Other specified behavioral and emotional disorders with onset usually occurring in childhood and adolescence: Secondary | ICD-10-CM

## 2019-02-04 MED ORDER — AMPHETAMINE-DEXTROAMPHETAMINE 10 MG PO TABS: 10.0000 mg | ORAL_TABLET | Freq: Two times a day (BID) | ORAL | 0 refills | Status: DC

## 2019-02-04 NOTE — Telephone Encounter (Signed)
This can only happens when scripts ae called in for refill, not when pharmacy request is received.

## 2019-02-04 NOTE — Telephone Encounter (Signed)
I sent the Rx refill to you as a phone message.

## 2019-02-04 NOTE — Telephone Encounter (Signed)
Rx request tab in epic for refill; that way we can easily do PMP review and approve RX?  Can that be set up or are refills in phone messages only now?

## 2019-02-04 NOTE — Telephone Encounter (Signed)
Medication Refill - Medication: amphetamine-dextroamphetamine (ADDERALL) 10 MG tablet    Preferred Pharmacy (with phone number or street name):  [id number] [place], [place] [phone] (Phone) [phone] (Fax)

## 2019-02-04 NOTE — Telephone Encounter (Signed)
Last Refill:  12/22/18  Last OV for ADD:  02/23/18

## 2019-03-31 ENCOUNTER — Other Ambulatory Visit: Payer: Self-pay

## 2019-03-31 ENCOUNTER — Encounter: Payer: Self-pay | Admitting: Internal Medicine

## 2019-03-31 ENCOUNTER — Telehealth: Payer: Self-pay | Admitting: Family Medicine

## 2019-03-31 ENCOUNTER — Ambulatory Visit (INDEPENDENT_AMBULATORY_CARE_PROVIDER_SITE_OTHER): Payer: BC Managed Care – PPO | Admitting: Internal Medicine

## 2019-03-31 DIAGNOSIS — J0100 Acute maxillary sinusitis, unspecified: Secondary | ICD-10-CM

## 2019-03-31 MED ORDER — AMOXICILLIN-POT CLAVULANATE 875-125 MG PO TABS: 1.0000 | ORAL_TABLET | Freq: Two times a day (BID) | ORAL | 0 refills | Status: DC

## 2019-03-31 MED ORDER — CHERATUSSIN AC 100-10 MG/5ML PO SOLN: 5.0000 mL | Freq: Three times a day (TID) | ORAL | 0 refills | Status: DC | PRN

## 2019-03-31 NOTE — Telephone Encounter (Signed)
Error

## 2019-03-31 NOTE — Assessment & Plan Note (Signed)
Urged patient to postpone use of antibiotics prescribed until he has tried saline lavage and decongestants for 24 hours.   [facility]  Probiotic advised

## 2019-03-31 NOTE — Progress Notes (Signed)
Virtual Visit via [email or web address]  This visit type was conducted due to national recommendations for restrictions regarding the COVID-19 pandemic (e.g. social distancing).  This format is felt to be most appropriate for this patient at this time.  All issues noted in this document were discussed and addressed.  No physical exam was performed (except for noted visual exam findings with Video Visits).   I connected with@ on 03/31/19 at  4:30 PM EST by a video enabled telemedicine application and verified that I am speaking with the correct person using two identifiers. Location patient: home Location provider: work or home office Persons participating in the virtual visit: patient, provider  I discussed the limitations, risks, security and privacy concerns of performing an evaluation and management service by telephone and the availability of in person appointments. I also discussed with the patient that there may be a patient responsible charge related to this service. The patient expressed understanding and agreed to proceed.  Reason for visit: sinusitis symptoms   HPI: 33 yr old male with history of sinus surgery for deviated septum remotely   Recurrent sinusitis occurring annually every fall.  He presents with two day history of sinus drainage ,  Tenderness in  his maxillary sinuses. Denies, dyspnea, chest pain, /pleurisy,  fever and body aches.  Cough is worse at night. Has not tried nasal deongestants or saline lavage     ROS: See pertinent positives and negatives per HPI.  Past Medical History:  Diagnosis Date  . Migraines     Past Surgical History:  Procedure Laterality Date  . broken arm     age 48    No family history on file.  SOCIAL HX: has been smoke free for 24 days.    Current Outpatient Medications:  .  amphetamine-dextroamphetamine (ADDERALL) 10 MG tablet, Take 1 tablet (10 mg total) by mouth 2 (two) times daily., Disp: 60 tablet, Rfl: 0 .  amoxicillin-clavulanate  (AUGMENTIN) 875-125 MG tablet, Take 1 tablet by mouth 2 (two) times daily., Disp: 14 tablet, Rfl: 0 .  guaiFENesin-codeine (CHERATUSSIN AC) 100-10 MG/5ML syrup, Take 5 mLs by mouth 3 (three) times daily as needed for cough., Disp: 120 mL, Rfl: 0  EXAM:  VITALS per patient if applicable:  GENERAL: alert, oriented, appears well and in no acute distress  HEENT: atraumatic, conjunttiva clear, no obvious abnormalities on inspection of external nose and ears  NECK: normal movements of the head and neck  LUNGS: on inspection no signs of respiratory distress, breathing rate appears normal, no obvious gross SOB, gasping or wheezing  CV: no obvious cyanosis  MS: moves all visible extremities without noticeable abnormality  PSYCH/NEURO: pleasant and cooperative, no obvious depression or anxiety, speech and thought processing grossly intact  ASSESSMENT AND PLAN:  Discussed the following assessment and plan:  Acute maxillary sinusitis, recurrence not specified  Sinusitis, acute maxillary Urged patient to postpone use of antibiotics prescribed until he has tried saline lavage and decongestants for 24 hours.   [facility]  Probiotic advised     I discussed the assessment and treatment plan with the patient. The patient was provided an opportunity to ask questions and all were answered. The patient agreed with the plan and demonstrated an understanding of the instructions.   The patient was advised to call back or seek an in-person evaluation if the symptoms worsen or if the condition fails to improve as anticipated.  I provided  15 minutes of non-face-to-face time during this  encounter reviewing patient's current problems and post surgeries.  Providing counseling on the above mentioned problems , and coordination  of care .   Sherlene Shams, MD

## 2019-03-31 NOTE — Patient Instructions (Signed)
I am  recommending  Use of decongestants and saline lavage/irrigation ( [employer] is a great product) for another 24 hours before starting the antibiotics for  Sinusitis , because this may be a viral infection.  Consider use of the following OTC meds to help with your  symptoms.   You can use  generic OTC benadryl 25 mg every 8 hours for the drainage,  Sudafed PE  10 to 30 mg every 8 hours for the congestion, you may substitute Afrin nasal spray for the nighttime dose of sudafed PE  If needed to prevent insomnia.  flush your sinuses twice daily with  Arletta Bale  (do over the sink because if you do it right you will spit out globs of mucus)   Gargle with salt water as needed for sore throat.   Use the cheratussin  liquid cough syrup at night for severe cough  It contains codeine,  So do not take while drinking alcohol.   Please take a probiotic ( Align, Floraque or Culturelle) while you are on the antibiotic to prevent  the  serious antibiotic associated diarrhea  Called clostridium dificile colitis

## 2019-05-10 ENCOUNTER — Other Ambulatory Visit: Payer: Self-pay

## 2019-05-10 ENCOUNTER — Ambulatory Visit (INDEPENDENT_AMBULATORY_CARE_PROVIDER_SITE_OTHER): Payer: BC Managed Care – PPO | Admitting: Family Medicine

## 2019-05-10 DIAGNOSIS — Z0001 Encounter for general adult medical examination with abnormal findings: Secondary | ICD-10-CM

## 2019-05-10 DIAGNOSIS — M9905 Segmental and somatic dysfunction of pelvic region: Secondary | ICD-10-CM | POA: Diagnosis not present

## 2019-05-10 DIAGNOSIS — M9903 Segmental and somatic dysfunction of lumbar region: Secondary | ICD-10-CM | POA: Diagnosis not present

## 2019-05-10 DIAGNOSIS — M25651 Stiffness of right hip, not elsewhere classified: Secondary | ICD-10-CM | POA: Diagnosis not present

## 2019-05-10 DIAGNOSIS — F419 Anxiety disorder, unspecified: Secondary | ICD-10-CM

## 2019-05-10 DIAGNOSIS — M5441 Lumbago with sciatica, right side: Secondary | ICD-10-CM | POA: Diagnosis not present

## 2019-05-10 MED ORDER — ESCITALOPRAM OXALATE 10 MG PO TABS: 10.0000 mg | ORAL_TABLET | Freq: Every day | ORAL | 1 refills | Status: DC

## 2019-05-10 NOTE — Progress Notes (Signed)
Virtual Visit via video Note  This visit type was conducted due to national recommendations for restrictions regarding the COVID-19 pandemic (e.g. social distancing).  This format is felt to be most appropriate for this patient at this time.  All issues noted in this document were discussed and addressed.  No physical exam was performed (except for noted visual exam findings with Video Visits).   I connected with Russell Gutierrez today at  9:00 AM EST by a video enabled telemedicine application and verified that I am speaking with the correct person using two identifiers. Location patient: home Location provider: work Persons participating in the virtual visit: patient, provider  I discussed the limitations, risks, security and privacy concerns of performing an evaluation and management service by telephone and the availability of in person appointments. I also discussed with the patient that there may be a patient responsible charge related to this service. The patient expressed understanding and agreed to proceed.  Reason for visit: CPE  HPI: Exercise: Walks with his dogs 1 hour a day. Diet: Cooking quite a bit.  Eats lots of vegetables.  Rare soda or sweet tea. No family history of prostate cancer or colon cancer. Plans on getting flu vaccine elsewhere.  Tetanus vaccine up-to-date. HIV screening due. Patient started back smoking this past year.  Drinks a glass of wine per day typically.  Occasional marijuana use. Sees a dentist and an ophthalmologist.  Anxiety: Patient had quite a bit of anxiety.  He had a friend that was murdered early in 2020.  He subsequently had additional significant life events that have added to this as well.  He has been doing therapy and has support with his friends.  He is also going to therapy with his wife.  He has not been sleeping well.  He notes no depression.  No SI.  He is interested in doing something to help with this.   ROS: See pertinent positives  and negatives per HPI.  Past Medical History:  Diagnosis Date  . Migraines     Past Surgical History:  Procedure Laterality Date  . broken arm     age 34    No family history on file.  SOCIAL HX: former smoker   Current Outpatient Medications:  .  amphetamine-dextroamphetamine (ADDERALL) 10 MG tablet, Take 1 tablet (10 mg total) by mouth 2 (two) times daily., Disp: 60 tablet, Rfl: 0 .  escitalopram (LEXAPRO) 10 MG tablet, Take 1 tablet (10 mg total) by mouth daily., Disp: 90 tablet, Rfl: 1  EXAM:  VITALS per patient if applicable:  GENERAL: alert, oriented, appears well and in no acute distress  HEENT: atraumatic, conjunttiva clear, no obvious abnormalities on inspection of external nose and ears  NECK: normal movements of the head and neck  LUNGS: on inspection no signs of respiratory distress, breathing rate appears normal, no obvious gross SOB, gasping or wheezing  CV: no obvious cyanosis  MS: moves all visible extremities without noticeable abnormality  PSYCH/NEURO: pleasant and cooperative, no obvious depression or anxiety, speech and thought processing grossly intact  ASSESSMENT AND PLAN:  Discussed the following assessment and plan:  Encounter for general adult medical examination with abnormal findings Physical exam completed.  Encouraged continued diet and exercise.  Encouraged to get a flu vaccine.  We will do HIV screening with his lab work.  Encouraged him to quit smoking.  Discussed monitoring alcohol intake so that he does not drink more than he already is.  Discussed stopping marijuana use  as it can contribute to anxiety as well.  Lab work to be completed at 6 weeks follow-up for his anxiety.  Anxiety Patient with a significant life event leading to this.  Offered support.  Encouraged continued therapy.  Will start on Lexapro.  Follow-up in 6 weeks.  Advised to contact us if he needed anything prior to then.  Discussed it may take 6 to 8 weeks for him to  notice full benefit from the medication.   No orders of the defined types were placed in this encounter.   Meds ordered this encounter  Medications  . escitalopram (LEXAPRO) 10 MG tablet    Sig: Take 1 tablet (10 mg total) by mouth daily.    Dispense:  90 tablet    Refill:  1     I discussed the assessment and treatment plan with the patient. The patient was provided an opportunity to ask questions and all were answered. The patient agreed with the plan and demonstrated an understanding of the instructions.   The patient was advised to call back or seek an in-person evaluation if the symptoms worsen or if the condition fails to improve as anticipated.   Marikay Alar, MD

## 2019-05-11 ENCOUNTER — Telehealth: Payer: Self-pay | Admitting: Family Medicine

## 2019-05-11 ENCOUNTER — Other Ambulatory Visit: Payer: Self-pay

## 2019-05-11 DIAGNOSIS — F988 Other specified behavioral and emotional disorders with onset usually occurring in childhood and adolescence: Secondary | ICD-10-CM

## 2019-05-11 NOTE — Telephone Encounter (Signed)
Pt needs a refill on amphetamine-dextroamphetamine (ADDERALL) 10 MG tablet sent to [facility] on [address] 

## 2019-05-12 DIAGNOSIS — M9903 Segmental and somatic dysfunction of lumbar region: Secondary | ICD-10-CM | POA: Diagnosis not present

## 2019-05-12 DIAGNOSIS — M25651 Stiffness of right hip, not elsewhere classified: Secondary | ICD-10-CM | POA: Diagnosis not present

## 2019-05-12 DIAGNOSIS — M9905 Segmental and somatic dysfunction of pelvic region: Secondary | ICD-10-CM | POA: Diagnosis not present

## 2019-05-12 DIAGNOSIS — M5441 Lumbago with sciatica, right side: Secondary | ICD-10-CM | POA: Diagnosis not present

## 2019-05-12 MED ORDER — AMPHETAMINE-DEXTROAMPHETAMINE 10 MG PO TABS: 10.0000 mg | ORAL_TABLET | Freq: Two times a day (BID) | ORAL | 0 refills | Status: DC

## 2019-05-13 DIAGNOSIS — F419 Anxiety disorder, unspecified: Secondary | ICD-10-CM | POA: Insufficient documentation

## 2019-05-13 DIAGNOSIS — Z0001 Encounter for general adult medical examination with abnormal findings: Secondary | ICD-10-CM | POA: Insufficient documentation

## 2019-05-13 NOTE — Assessment & Plan Note (Signed)
Patient with a significant life event leading to this.  Offered support.  Encouraged continued therapy.  Will start on Lexapro.  Follow-up in 6 weeks.  Advised to contact us if he needed anything prior to then.  Discussed it may take 6 to 8 weeks for him to notice full benefit from the medication.

## 2019-05-13 NOTE — Assessment & Plan Note (Signed)
Physical exam completed.  Encouraged continued diet and exercise.  Encouraged to get a flu vaccine.  We will do HIV screening with his lab work.  Encouraged him to quit smoking.  Discussed monitoring alcohol intake so that he does not drink more than he already is.  Discussed stopping marijuana use as it can contribute to anxiety as well.  Lab work to be completed at 6 weeks follow-up for his anxiety.

## 2019-05-14 ENCOUNTER — Ambulatory Visit: Payer: BC Managed Care – PPO | Attending: Internal Medicine

## 2019-05-14 DIAGNOSIS — Z20822 Contact with and (suspected) exposure to covid-19: Secondary | ICD-10-CM

## 2019-05-15 LAB — NOVEL CORONAVIRUS, NAA: SARS-CoV-2, NAA: NOT DETECTED

## 2019-05-17 ENCOUNTER — Ambulatory Visit (INDEPENDENT_AMBULATORY_CARE_PROVIDER_SITE_OTHER): Payer: BC Managed Care – PPO | Admitting: Family

## 2019-05-17 ENCOUNTER — Encounter: Payer: Self-pay | Admitting: Family

## 2019-05-17 VITALS — Ht 71.0 in | Wt 150.0 lb

## 2019-05-17 DIAGNOSIS — F419 Anxiety disorder, unspecified: Secondary | ICD-10-CM

## 2019-05-17 MED ORDER — TRAZODONE HCL 50 MG PO TABS: 25.0000 mg | ORAL_TABLET | Freq: Every evening | ORAL | 3 refills | Status: DC | PRN

## 2019-05-17 MED ORDER — CLONAZEPAM 0.5 MG PO TABS: 0.2500 mg | ORAL_TABLET | Freq: Every day | ORAL | 0 refills | Status: DC

## 2019-05-17 NOTE — Progress Notes (Signed)
Virtual Visit via Video Note  I connected with@  on 05/17/19 at  8:30 AM EST by a video enabled telemedicine application and verified that I am speaking with the correct person using two identifiers.  Location patient: home Location provider: home office Persons participating in the virtual visit: patient, provider  I discussed the limitations of evaluation and management by telemedicine and the availability of in person appointments. The patient expressed understanding and agreed to proceed.  Interactive audio and video telecommunications were attempted between this provider and patient, however failed, due to patient having technical difficulties or patient did not have access to video capability.  We continued and completed visit with audio only.    HPI: CC: trouble sleeping and increased anxiety. Gets anxious about not being able to sleep, thus work.  Anxiety is predictable, for the most part at night. Only slept three hours total sleep last week. Tried ZQuil without result. Drinking caffeine free tea and tries to wind down. Has been using [employer] bike and using that more frequently past 2 weeks   Has had lost a best friend, father in law in the same month. Has an 18 month child. Lost a nanny whom was positive for COVID.  Wife also works from home.    never started lexapro 05/10/19. 'doesn't need or want long term medication.'  A big turn off is having to medication long term or everyday.  In the past had been on klonopin in the past which worked well.   No SI/HI. No h/o SI or self harm.  Currently in counseling.   Takes adderall less recently as wandered if contributory to anxiety. Doesn't seem to influence anxiety, sleep when he takes adderall less.  ROS: See pertinent positives and negatives per HPI.  Past Medical History:  Diagnosis Date  . Migraines     Past Surgical History:  Procedure Laterality Date  . broken arm     age 34    History reviewed. No pertinent family  history.  SOCIAL HX: non smoker   Current Outpatient Medications:  .  amphetamine-dextroamphetamine (ADDERALL) 10 MG tablet, Take 1 tablet (10 mg total) by mouth 2 (two) times daily., Disp: 60 tablet, Rfl: 0 .  clonazePAM (KLONOPIN) 0.5 MG tablet, Take 0.5 tablets (0.25 mg total) by mouth at bedtime., Disp: 15 tablet, Rfl: 0 .  traZODone (DESYREL) 50 MG tablet, Take 0.5-1 tablets (25-50 mg total) by mouth at bedtime as needed for sleep., Disp: 30 tablet, Rfl: 3  EXAM:  VITALS per patient if applicable: Depression screen [facility] 2/9 05/10/2019 12/21/2018 12/24/2017  Decreased Interest 0 0 0  Down, Depressed, Hopeless 0 0 0  PHQ - 2 Score 0 0 0  Altered sleeping - 0 -  Tired, decreased energy - 0 -  Change in appetite - 0 -  Feeling bad or failure about yourself  - 0 -  Trouble concentrating - 0 -  Moving slowly or fidgety/restless - 0 -  Suicidal thoughts - 0 -  PHQ-9 Score - 0 -  Difficult doing work/chores - Not difficult at all -     GENERAL: alert, oriented, appears well and in no acute distress  HEENT: atraumatic, conjunttiva clear, no obvious abnormalities on inspection of external nose and ears  NECK: normal movements of the head and neck  LUNGS: on inspection no signs of respiratory distress, breathing rate appears normal, no obvious gross SOB, gasping or wheezing  CV: no obvious cyanosis  MS: moves all visible extremities without noticeable  abnormality  PSYCH/NEURO: pleasant and cooperative, no obvious depression or anxiety, speech and thought processing grossly intact  ASSESSMENT AND PLAN:  Discussed the following assessment and plan: Problem List Items Addressed This Visit      Other   Anxiety - Primary    Long discussion with patient and he was very frank with his primary concern is his inability to sleep,certainly compounded by grief and stress with COVID, childcare.  He did not want a medication that was long-term nor that he had to take every day.  He wanted  more of an as needed regimen.  For that reason he never started the Lexapro and I have discontinued today.  I discussed with him the risk of benzodiazepines including addiction, cognitive decline.  Ultimately we agreed that 15 tablets 0.25mg  Klonopin was appropriate for those nights which he just could not fall asleep however I wanted him to use trazodone more often.  Advised that I do not see a long-term need for the Klonopin; it was just until things in his life could improve.  He would continue counseling with his wife.  Discussed safe storage of klonopin.  Follow-up in 2 weeks. I looked up patient on [place] Controlled Substances Reporting System and saw no activity that raised concern of inappropriate use.        Relevant Medications   clonazePAM (KLONOPIN) 0.5 MG tablet   traZODone (DESYREL) 50 MG tablet      -we discussed possible serious and likely etiologies, options for evaluation and workup, limitations of telemedicine visit vs in person visit, treatment, treatment risks and precautions. Pt prefers to treat via telemedicine empirically rather then risking or undertaking an in person visit at this moment. Patient agrees to seek prompt in person care if worsening, new symptoms arise, or if is not improving with treatment.   I discussed the assessment and treatment plan with the patient. The patient was provided an opportunity to ask questions and all were answered. The patient agreed with the plan and demonstrated an understanding of the instructions.   The patient was advised to call back or seek an in-person evaluation if the symptoms worsen or if the condition fails to improve as anticipated.   [name], FNP  I spent 25 min non face to face w/ pt. Of which greater than 50% of time was spent Discussing Regimens for safe treatment of anxiety, insomnia.

## 2019-05-17 NOTE — Assessment & Plan Note (Addendum)
Long discussion with patient and he was very frank with his primary concern is his inability to sleep,certainly compounded by grief and stress with COVID, childcare.  He did not want a medication that was long-term nor that he had to take every day.  He wanted more of an as needed regimen.  For that reason he never started the Lexapro and I have discontinued today.  I discussed with him the risk of benzodiazepines including addiction, cognitive decline.  Ultimately we agreed that 15 tablets 0.25mg  Klonopin was appropriate for those nights which he just could not fall asleep however I wanted him to use trazodone more often.  Advised that I do not see a long-term need for the Klonopin; it was just until things in his life could improve.  He would continue counseling with his wife.  Discussed safe storage of klonopin.  Follow-up in 2 weeks. I looked up patient on [place] Controlled Substances Reporting System and saw no activity that raised concern of inappropriate use.

## 2019-05-18 ENCOUNTER — Ambulatory Visit: Payer: BC Managed Care – PPO | Attending: Internal Medicine

## 2019-05-18 DIAGNOSIS — Z20822 Contact with and (suspected) exposure to covid-19: Secondary | ICD-10-CM

## 2019-05-20 LAB — NOVEL CORONAVIRUS, NAA: SARS-CoV-2, NAA: NOT DETECTED

## 2019-06-07 ENCOUNTER — Encounter: Payer: Self-pay | Admitting: Family

## 2019-06-07 ENCOUNTER — Other Ambulatory Visit: Payer: Self-pay

## 2019-06-07 ENCOUNTER — Ambulatory Visit (INDEPENDENT_AMBULATORY_CARE_PROVIDER_SITE_OTHER): Payer: BC Managed Care – PPO | Admitting: Family

## 2019-06-07 DIAGNOSIS — F419 Anxiety disorder, unspecified: Secondary | ICD-10-CM | POA: Diagnosis not present

## 2019-06-07 NOTE — Assessment & Plan Note (Signed)
Pleased to here resolved and he is sleeping well. Advised he may continue trazodone prn or may find more effective in future ,if needed , to be taken nightly . Again advised very cautious , sparing use of klonopin.

## 2019-06-07 NOTE — Progress Notes (Signed)
Virtual Visit via Video Note  I connected with@  on 06/07/19 at 11:30 AM EST by a video enabled telemedicine application and verified that I am speaking with the correct person using two identifiers.  Location patient: home Location provider:work Persons participating in the virtual visit: patient, provider  I discussed the limitations of evaluation and management by telemedicine and the availability of in person appointments. The patient expressed understanding and agreed to proceed.   HPI:  Follow up.  No complaints. NO concerns.  Anxiety and insomnia has resolved. Feels well. Coping well.  He only required 4 nights of the  klonopin and trazodone and 'that's all I needed'. Wife is expecting and he is excited about that.    ROS: See pertinent positives and negatives per HPI.  Past Medical History:  Diagnosis Date  . Migraines     Past Surgical History:  Procedure Laterality Date  . broken arm     age 34    History reviewed. No pertinent family history.  SOCIAL HX: former smoker   Current Outpatient Medications:  .  amphetamine-dextroamphetamine (ADDERALL) 10 MG tablet, Take 1 tablet (10 mg total) by mouth 2 (two) times daily., Disp: 60 tablet, Rfl: 0 .  clonazePAM (KLONOPIN) 0.5 MG tablet, Take 0.5 tablets (0.25 mg total) by mouth at bedtime., Disp: 15 tablet, Rfl: 0 .  traZODone (DESYREL) 50 MG tablet, Take 0.5-1 tablets (25-50 mg total) by mouth at bedtime as needed for sleep., Disp: 30 tablet, Rfl: 3  EXAM:  VITALS per patient if applicable:  GENERAL: alert, oriented, appears well and in no acute distress  HEENT: atraumatic, conjunttiva clear, no obvious abnormalities on inspection of external nose and ears  NECK: normal movements of the head and neck  LUNGS: on inspection no signs of respiratory distress, breathing rate appears normal, no obvious gross SOB, gasping or wheezing  CV: no obvious cyanosis  MS: moves all visible extremities without noticeable  abnormality  PSYCH/NEURO: Smiling. pleasant and cooperative, no obvious depression or anxiety, speech and thought processing grossly intact  ASSESSMENT AND PLAN:  Discussed the following assessment and plan:  Anxiety Problem List Items Addressed This Visit      Other   Anxiety    Pleased to here resolved and he is sleeping well. Advised he may continue trazodone prn or may find more effective in future ,if needed , to be taken nightly . Again advised very cautious , sparing use of klonopin.          -we discussed possible serious and likely etiologies, options for evaluation and workup, limitations of telemedicine visit vs in person visit, treatment, treatment risks and precautions. Pt prefers to treat via telemedicine empirically rather then risking or undertaking an in person visit at this moment. Patient agrees to seek prompt in person care if worsening, new symptoms arise, or if is not improving with treatment.   I discussed the assessment and treatment plan with the patient. The patient was provided an opportunity to ask questions and all were answered. The patient agreed with the plan and demonstrated an understanding of the instructions.   The patient was advised to call back or seek an in-person evaluation if the symptoms worsen or if the condition fails to improve as anticipated.   [name], FNP

## 2019-06-07 NOTE — Progress Notes (Deleted)
   Subjective:    Patient ID: Russell Gutierrez, male    DOB: 02/12/1986, 34 y.o.   MRN: [id number]  CC: Russell Gutierrez is a 34 y.o. male who presents today for follow up.   HPI: Has been a patient of Lauren Guse.  Establishing care with me today  H/o adhd  Anxiety-on Klonopin, tried the   HISTORY:  Past Medical History:  Diagnosis Date  . Migraines    Past Surgical History:  Procedure Laterality Date  . broken arm     age 7   No family history on file.  Allergies: Patient has no known allergies. Current Outpatient Medications on File Prior to Visit  Medication Sig Dispense Refill  . amphetamine-dextroamphetamine (ADDERALL) 10 MG tablet Take 1 tablet (10 mg total) by mouth 2 (two) times daily. 60 tablet 0  . clonazePAM (KLONOPIN) 0.5 MG tablet Take 0.5 tablets (0.25 mg total) by mouth at bedtime. 15 tablet 0  . traZODone (DESYREL) 50 MG tablet Take 0.5-1 tablets (25-50 mg total) by mouth at bedtime as needed for sleep. 30 tablet 3   No current facility-administered medications on file prior to visit.    Social History   Tobacco Use  . Smoking status: Former Games developer  . Smokeless tobacco: Never Used  Substance Use Topics  . Alcohol use: Yes    Comment: glass of wine here or there.   . Drug use: Never    Review of Systems    Objective:    There were no vitals taken for this visit. BP Readings from Last 3 Encounters:  06/08/18 120/84  05/29/18 110/68  02/23/18 114/62   Wt Readings from Last 3 Encounters:  05/17/19 150 lb (68 kg)  05/10/19 150 lb (68 kg)  03/31/19 151 lb (68.5 kg)    Physical Exam     Assessment & Plan:   Problem List Items Addressed This Visit    None       I am having [name] maintain his amphetamine-dextroamphetamine, clonazePAM, and traZODone.   No orders of the defined types were placed in this encounter.   Return precautions given.   Risks, benefits, and alternatives of the medications and treatment plan  prescribed today were discussed, and patient expressed understanding.   Education regarding symptom management and diagnosis given to patient on AVS.  Continue to follow with Allegra Grana, FNP for routine health maintenance.   Russell Gutierrez and I agreed with plan.   [name], FNP

## 2019-06-08 ENCOUNTER — Other Ambulatory Visit: Payer: Self-pay | Admitting: Family

## 2019-06-08 DIAGNOSIS — F419 Anxiety disorder, unspecified: Secondary | ICD-10-CM

## 2019-06-23 ENCOUNTER — Ambulatory Visit: Payer: BC Managed Care – PPO | Admitting: Family Medicine

## 2019-07-06 ENCOUNTER — Telehealth: Payer: Self-pay | Admitting: Family

## 2019-07-06 DIAGNOSIS — F988 Other specified behavioral and emotional disorders with onset usually occurring in childhood and adolescence: Secondary | ICD-10-CM

## 2019-07-06 NOTE — Telephone Encounter (Signed)
Pt needs refill on amphetamine-dextroamphetamine (ADDERALL) 10 MG tablet

## 2019-07-06 NOTE — Telephone Encounter (Signed)
Refill request for Adderall, last seen 06-07-19, last filled 05-12-19.  Please advise.

## 2019-07-07 MED ORDER — AMPHETAMINE-DEXTROAMPHETAMINE 10 MG PO TABS: 10.0000 mg | ORAL_TABLET | Freq: Two times a day (BID) | ORAL | 0 refills | Status: DC

## 2019-07-07 NOTE — Addendum Note (Signed)
Addended by: Allegra Grana on: 07/07/2019 03:16 PM   Modules accepted: Orders

## 2019-07-07 NOTE — Telephone Encounter (Signed)
Call pt [facility]  I looked up patient on [place] Controlled Substances Reporting System and saw no activity that raised concern of inappropriate use.

## 2019-07-09 ENCOUNTER — Ambulatory Visit: Payer: BC Managed Care – PPO | Attending: Internal Medicine

## 2019-07-09 ENCOUNTER — Telehealth (INDEPENDENT_AMBULATORY_CARE_PROVIDER_SITE_OTHER): Payer: BC Managed Care – PPO | Admitting: Family

## 2019-07-09 ENCOUNTER — Encounter: Payer: Self-pay | Admitting: Family

## 2019-07-09 VITALS — Ht 71.0 in | Wt 155.0 lb

## 2019-07-09 DIAGNOSIS — Z20822 Contact with and (suspected) exposure to covid-19: Secondary | ICD-10-CM | POA: Diagnosis not present

## 2019-07-09 DIAGNOSIS — J329 Chronic sinusitis, unspecified: Secondary | ICD-10-CM | POA: Insufficient documentation

## 2019-07-09 DIAGNOSIS — J0100 Acute maxillary sinusitis, unspecified: Secondary | ICD-10-CM

## 2019-07-09 MED ORDER — CHERATUSSIN AC 100-10 MG/5ML PO SYRP: 5.0000 mL | ORAL_SOLUTION | Freq: Every evening | ORAL | 0 refills | Status: DC | PRN

## 2019-07-09 NOTE — Patient Instructions (Signed)
Start augmentin that you have at home Start mucinex   Please take cough medication at night only as needed. As we discussed, I do not recommend dosing throughout the day as coughing is a protective mechanism . It also helps to break up thick mucous.  Do not take cough suppressants with alcohol as can lead to trouble breathing. Advise caution if taking cough suppressant and operating machinery ( i.e driving a car) as you may feel very tired.

## 2019-07-09 NOTE — Assessment & Plan Note (Signed)
Patient is well-appearing, he is not toxic in appearance.  Discussed with him most likely viral etiology however he is on the cusp of based on duration of symptoms perhaps a bacterial secondary infection.  Advise appropriate to trial Mucinex for couple days and if no improvement he may start the Augmentin which he has at home.  Advised certainly for symptoms were to regress, he should start the Augmentin right away.  Given him Cheratussin which worked well in the past.  He will let me know how he is doing

## 2019-07-09 NOTE — Progress Notes (Signed)
Virtual Visit via Video Note  I connected with@  on 07/09/19 at  1:30 PM EST by a video enabled telemedicine application and verified that I am speaking with the correct person using two identifiers.  Location patient: home Location provider:work or home office Persons participating in the virtual visit: patient, provider  I discussed the limitations of evaluation and management by telemedicine and the availability of in person appointments. The patient expressed understanding and agreed to proceed.   HPI:  CC: sinus congestion, productive cough started one week ago, unchanged.  No fever, sob, cp, N, v, sore throat, pain with eating.   Has been using nyquil, theraflu with some relief.    H/o sinus surgery for deviated septum.   Seen by Darrick Huntsman , 03/2019 for sinusitis. Didn't take augmentin at that time however still has this prescription at home  Doesn't take antihistamine as no ongoing issues.  ROS: See pertinent positives and negatives per HPI.  Past Medical History:  Diagnosis Date  . Migraines     Past Surgical History:  Procedure Laterality Date  . broken arm     age 34    History reviewed. No pertinent family history.     Current Outpatient Medications:  .  amphetamine-dextroamphetamine (ADDERALL) 10 MG tablet, Take 1 tablet (10 mg total) by mouth 2 (two) times daily., Disp: 60 tablet, Rfl: 0 .  clonazePAM (KLONOPIN) 0.5 MG tablet, Take 0.5 tablets (0.25 mg total) by mouth at bedtime., Disp: 15 tablet, Rfl: 0 .  traZODone (DESYREL) 50 MG tablet, TAKE 0.5-1 TABLETS (25-50 MG TOTAL) BY MOUTH AT BEDTIME AS NEEDED FOR SLEEP., Disp: 90 tablet, Rfl: 2 .  guaiFENesin-codeine (CHERATUSSIN AC) 100-10 MG/5ML syrup, Take 5 mLs by mouth at bedtime as needed for cough or congestion., Disp: 50 mL, Rfl: 0  EXAM:  VITALS per patient if applicable:  GENERAL: alert, oriented, appears well and in no acute distress  HEENT: atraumatic, conjunttiva clear, no obvious abnormalities  on inspection of external nose and ears  NECK: normal movements of the head and neck  LUNGS: on inspection no signs of respiratory distress, breathing rate appears normal, no obvious gross SOB, gasping or wheezing  CV: no obvious cyanosis  MS: moves all visible extremities without noticeable abnormality  PSYCH/NEURO: pleasant and cooperative, no obvious depression or anxiety, speech and thought processing grossly intact  ASSESSMENT AND PLAN:  Discussed the following assessment and plan:  Acute non-recurrent maxillary sinusitis - Plan: guaiFENesin-codeine (CHERATUSSIN AC) 100-10 MG/5ML syrup Problem List Items Addressed This Visit      Respiratory   Sinusitis - Primary    Patient is well-appearing, he is not toxic in appearance.  Discussed with him most likely viral etiology however he is on the cusp of based on duration of symptoms perhaps a bacterial secondary infection.  Advise appropriate to trial Mucinex for couple days and if no improvement he may start the Augmentin which he has at home.  Advised certainly for symptoms were to regress, he should start the Augmentin right away.  Given him Cheratussin which worked well in the past.  He will let me know how he is doing      Relevant Medications   guaiFENesin-codeine (CHERATUSSIN AC) 100-10 MG/5ML syrup      -we discussed possible serious and likely etiologies, options for evaluation and workup, limitations of telemedicine visit vs in person visit, treatment, treatment risks and precautions. Pt prefers to treat via telemedicine empirically rather then risking or undertaking an in person visit  at this moment. Patient agrees to seek prompt in person care if worsening, new symptoms arise, or if is not improving with treatment.   I discussed the assessment and treatment plan with the patient. The patient was provided an opportunity to ask questions and all were answered. The patient agreed with the plan and demonstrated an understanding  of the instructions.   The patient was advised to call back or seek an in-person evaluation if the symptoms worsen or if the condition fails to improve as anticipated.   Mable Paris, FNP

## 2019-07-10 LAB — SPECIMEN STATUS REPORT

## 2019-07-10 LAB — NOVEL CORONAVIRUS, NAA: SARS-CoV-2, NAA: NOT DETECTED

## 2019-07-29 ENCOUNTER — Other Ambulatory Visit: Payer: BC Managed Care – PPO

## 2019-07-30 ENCOUNTER — Ambulatory Visit: Payer: BC Managed Care – PPO | Attending: Internal Medicine

## 2019-07-30 DIAGNOSIS — Z20822 Contact with and (suspected) exposure to covid-19: Secondary | ICD-10-CM

## 2019-07-31 LAB — NOVEL CORONAVIRUS, NAA: SARS-CoV-2, NAA: NOT DETECTED

## 2019-07-31 LAB — SARS-COV-2, NAA 2 DAY TAT

## 2019-08-30 ENCOUNTER — Telehealth: Payer: Self-pay | Admitting: Family

## 2019-08-30 DIAGNOSIS — F988 Other specified behavioral and emotional disorders with onset usually occurring in childhood and adolescence: Secondary | ICD-10-CM

## 2019-08-30 NOTE — Telephone Encounter (Signed)
Patient needs a refill on amphetamine-dextroamphetamine (ADDERALL) 10 MG tablet.

## 2019-08-30 NOTE — Telephone Encounter (Signed)
Patient does not have documentation of formal testing. He will need referral. I called [facility] & they do have refill for patient. I have left LM for him to let him know this.

## 2019-08-30 NOTE — Telephone Encounter (Signed)
Call pt  There should be a refill for adderall at pharmacy with date 09/06/19. I sent this in with his last refill.   Advise requests for controlled substance through pharmacy or mychart refill request ONE week in advance so no delays in prescription. If patient call office  for refill there can be delays due to call volume and doesn't go to refill inbox.   This patient transferred care to me from Lauren; do not have formal diagnostic testing ADHD.  I am sorry I did not realize this when transferred care.  If unable to obtain, will retest.  Please also send him controlled substance contract  I looked up patient on [place] Controlled Substances Reporting System PMP AWARE and saw no activity that raised concern of inappropriate use.

## 2019-08-30 NOTE — Telephone Encounter (Signed)
Refill request for adderral, last seen 07-09-19, last filled 07-07-19.  Please advise.

## 2019-09-01 NOTE — Addendum Note (Signed)
Addended by: Allegra Grana on: 09/01/2019 11:10 AM   Modules accepted: Orders

## 2019-09-01 NOTE — Telephone Encounter (Signed)
Referral placed.

## 2019-09-17 IMAGING — US US ABDOMEN LIMITED
1 series · 14 of 25 positions shown · non-contrast
Comparison: None.

CLINICAL DATA: Elevated bilirubin.

EXAM:
ULTRASOUND ABDOMEN LIMITED RIGHT UPPER QUADRANT

[Series 1: us abdomen limited · 14 of 49 slices shown]
[im 1/49]
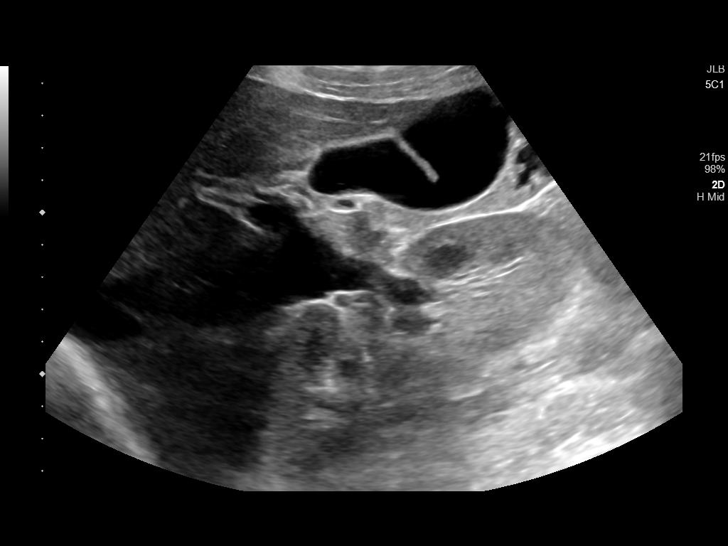
[im 5/49]
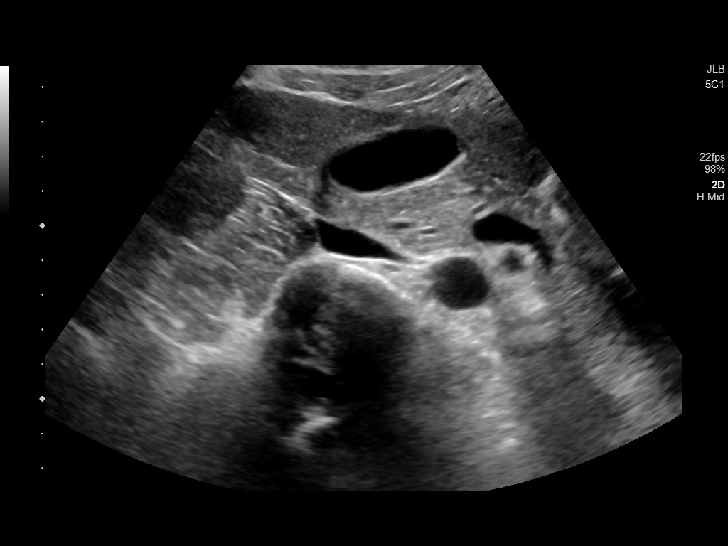
[im 9/49]
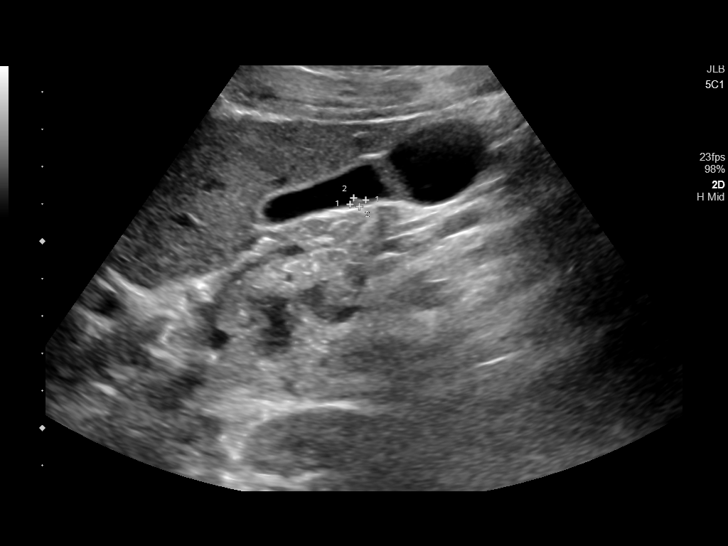
[im 13/49]
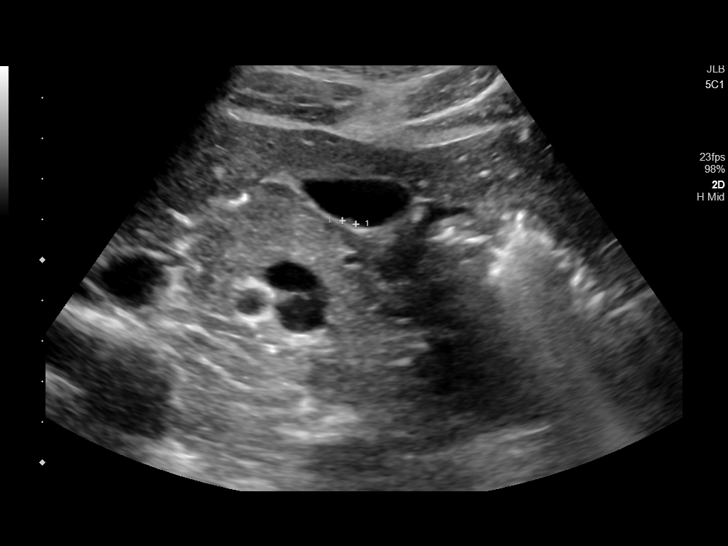
[im 17/49]
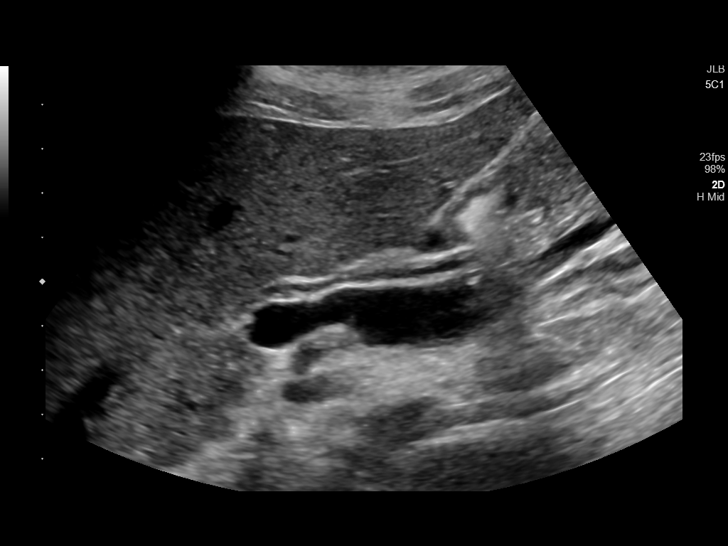
[im 19/49]
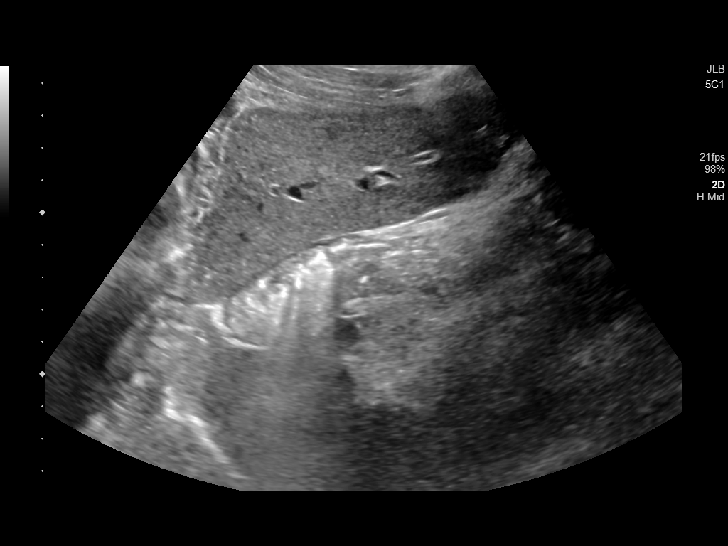
[im 23/49]
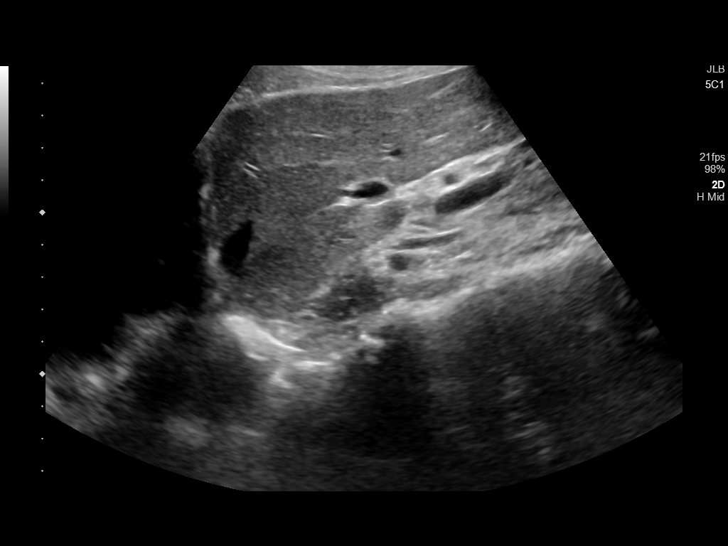
[im 27/49]
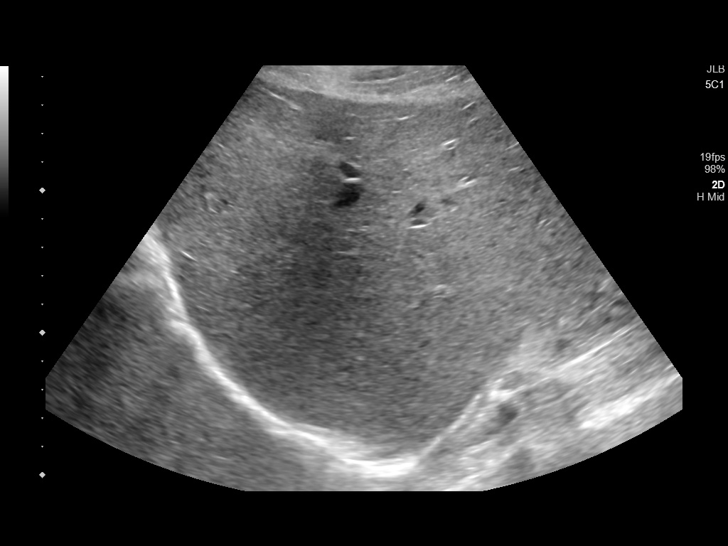
[im 31/49]
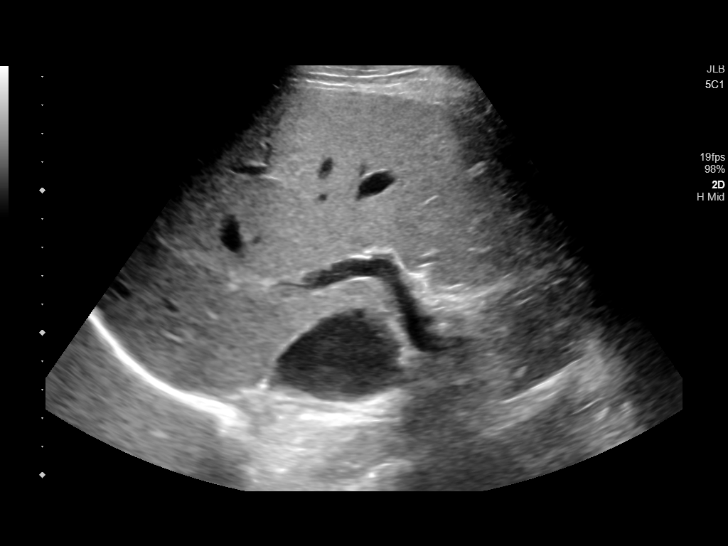
[im 33/49]
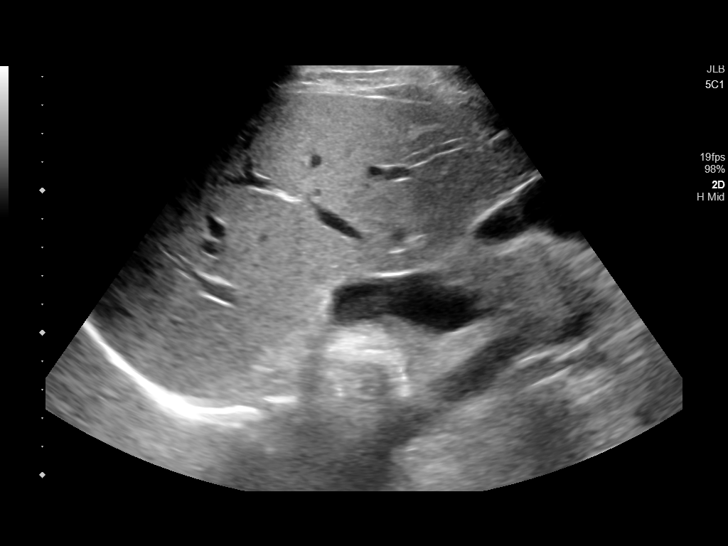
[im 37/49]
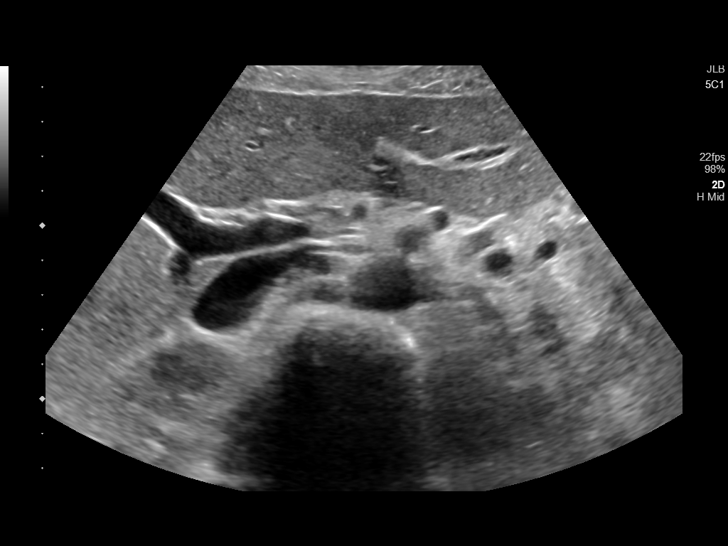
[im 41/49]
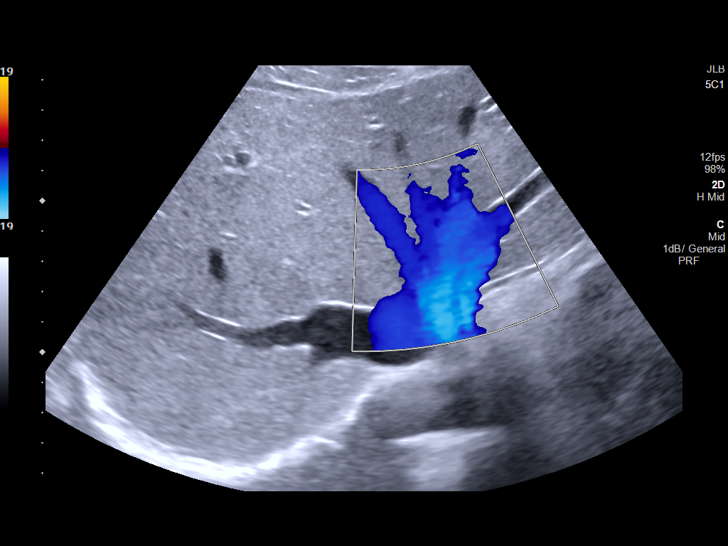
[im 45/49]
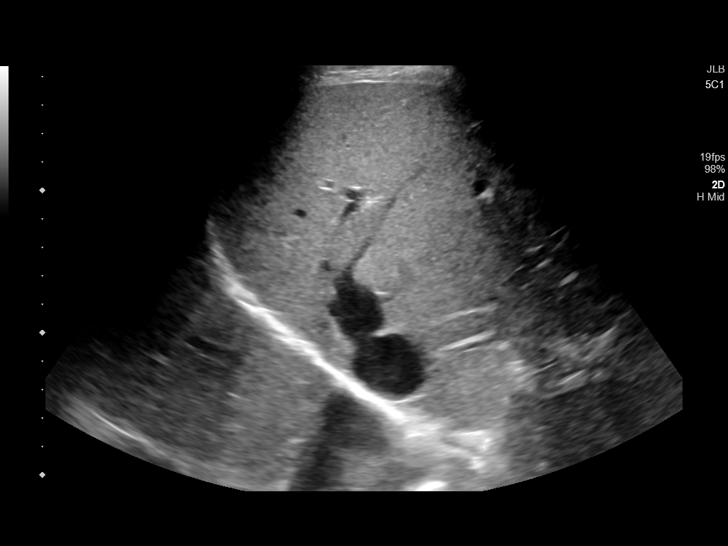
[im 49/49]
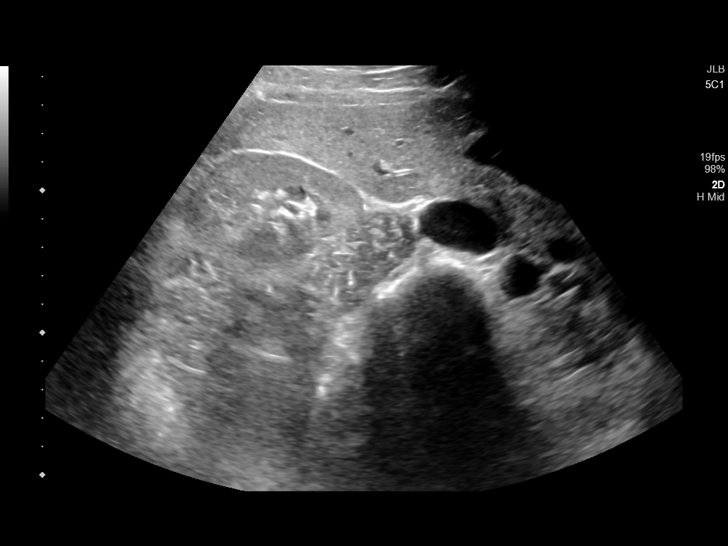

[14 of 25 positions shown; findings below may reference images not displayed]

FINDINGS: Gallbladder:

No gallstones or wall thickening visualized. No sonographic Murphy
sign noted by sonographer. Small polyps measure 4 mm or less in
size.

Common bile duct:

Diameter: 2 mm, within normal limits.

Liver:

No focal lesion identified. Within normal limits in parenchymal
echogenicity. Portal vein is patent on color Doppler imaging with
normal direction of blood flow towards the liver.
IMPRESSION: No acute findings.

## 2019-10-11 DIAGNOSIS — M9903 Segmental and somatic dysfunction of lumbar region: Secondary | ICD-10-CM | POA: Diagnosis not present

## 2019-10-11 DIAGNOSIS — M9905 Segmental and somatic dysfunction of pelvic region: Secondary | ICD-10-CM | POA: Diagnosis not present

## 2019-10-11 DIAGNOSIS — M25651 Stiffness of right hip, not elsewhere classified: Secondary | ICD-10-CM | POA: Diagnosis not present

## 2019-10-11 DIAGNOSIS — M5441 Lumbago with sciatica, right side: Secondary | ICD-10-CM | POA: Diagnosis not present

## 2019-10-25 DIAGNOSIS — M5441 Lumbago with sciatica, right side: Secondary | ICD-10-CM | POA: Diagnosis not present

## 2019-10-25 DIAGNOSIS — M9903 Segmental and somatic dysfunction of lumbar region: Secondary | ICD-10-CM | POA: Diagnosis not present

## 2019-10-25 DIAGNOSIS — M25651 Stiffness of right hip, not elsewhere classified: Secondary | ICD-10-CM | POA: Diagnosis not present

## 2019-10-25 DIAGNOSIS — M9905 Segmental and somatic dysfunction of pelvic region: Secondary | ICD-10-CM | POA: Diagnosis not present

## 2019-11-01 DIAGNOSIS — M9905 Segmental and somatic dysfunction of pelvic region: Secondary | ICD-10-CM | POA: Diagnosis not present

## 2019-11-01 DIAGNOSIS — M5441 Lumbago with sciatica, right side: Secondary | ICD-10-CM | POA: Diagnosis not present

## 2019-11-01 DIAGNOSIS — M9903 Segmental and somatic dysfunction of lumbar region: Secondary | ICD-10-CM | POA: Diagnosis not present

## 2019-11-01 DIAGNOSIS — M25651 Stiffness of right hip, not elsewhere classified: Secondary | ICD-10-CM | POA: Diagnosis not present

## 2019-11-15 DIAGNOSIS — M5441 Lumbago with sciatica, right side: Secondary | ICD-10-CM | POA: Diagnosis not present

## 2019-11-15 DIAGNOSIS — M9905 Segmental and somatic dysfunction of pelvic region: Secondary | ICD-10-CM | POA: Diagnosis not present

## 2019-11-15 DIAGNOSIS — M25651 Stiffness of right hip, not elsewhere classified: Secondary | ICD-10-CM | POA: Diagnosis not present

## 2019-11-15 DIAGNOSIS — M9903 Segmental and somatic dysfunction of lumbar region: Secondary | ICD-10-CM | POA: Diagnosis not present

## 2019-11-22 DIAGNOSIS — M5441 Lumbago with sciatica, right side: Secondary | ICD-10-CM | POA: Diagnosis not present

## 2019-11-22 DIAGNOSIS — M9905 Segmental and somatic dysfunction of pelvic region: Secondary | ICD-10-CM | POA: Diagnosis not present

## 2019-11-22 DIAGNOSIS — M9903 Segmental and somatic dysfunction of lumbar region: Secondary | ICD-10-CM | POA: Diagnosis not present

## 2019-11-22 DIAGNOSIS — M25651 Stiffness of right hip, not elsewhere classified: Secondary | ICD-10-CM | POA: Diagnosis not present

## 2019-11-29 DIAGNOSIS — M9905 Segmental and somatic dysfunction of pelvic region: Secondary | ICD-10-CM | POA: Diagnosis not present

## 2019-11-29 DIAGNOSIS — M5441 Lumbago with sciatica, right side: Secondary | ICD-10-CM | POA: Diagnosis not present

## 2019-11-29 DIAGNOSIS — M9903 Segmental and somatic dysfunction of lumbar region: Secondary | ICD-10-CM | POA: Diagnosis not present

## 2019-11-29 DIAGNOSIS — M25651 Stiffness of right hip, not elsewhere classified: Secondary | ICD-10-CM | POA: Diagnosis not present

## 2019-12-03 ENCOUNTER — Ambulatory Visit: Payer: BLUE CROSS/BLUE SHIELD | Attending: Internal Medicine

## 2019-12-03 DIAGNOSIS — Z23 Encounter for immunization: Secondary | ICD-10-CM

## 2019-12-03 NOTE — Progress Notes (Signed)
   Covid-19 Vaccination Clinic  Name:  Russell Gutierrez    MRN: [id number] DOB: 1985-06-11  12/03/2019  [name] was observed post Covid-19 immunization for 15 minutes without incident. He was provided with Vaccine Information Sheet and instruction to access the V-Safe system.   [name] was instructed to call 911 with any severe reactions post vaccine: Marland Kitchen Difficulty breathing  . Swelling of face and throat  . A fast heartbeat  . A bad rash all over body  . Dizziness and weakness   Immunizations Administered    Name Date Dose VIS Date Route   Pfizer COVID-19 Vaccine 12/03/2019 11:56 AM 0.3 mL 06/30/2018 Intramuscular   Manufacturer: [employer], [employer]   Lot: [id number]   NDC: [address]-6

## 2019-12-06 DIAGNOSIS — M25651 Stiffness of right hip, not elsewhere classified: Secondary | ICD-10-CM | POA: Diagnosis not present

## 2019-12-06 DIAGNOSIS — M9903 Segmental and somatic dysfunction of lumbar region: Secondary | ICD-10-CM | POA: Diagnosis not present

## 2019-12-06 DIAGNOSIS — M9905 Segmental and somatic dysfunction of pelvic region: Secondary | ICD-10-CM | POA: Diagnosis not present

## 2019-12-06 DIAGNOSIS — M5441 Lumbago with sciatica, right side: Secondary | ICD-10-CM | POA: Diagnosis not present

## 2019-12-07 ENCOUNTER — Telehealth (INDEPENDENT_AMBULATORY_CARE_PROVIDER_SITE_OTHER): Payer: BLUE CROSS/BLUE SHIELD | Admitting: Nurse Practitioner

## 2019-12-07 ENCOUNTER — Other Ambulatory Visit: Payer: Self-pay

## 2019-12-07 ENCOUNTER — Encounter: Payer: Self-pay | Admitting: Nurse Practitioner

## 2019-12-07 VITALS — Temp 98.6°F | Ht 71.0 in | Wt 155.0 lb

## 2019-12-07 DIAGNOSIS — J0191 Acute recurrent sinusitis, unspecified: Secondary | ICD-10-CM | POA: Diagnosis not present

## 2019-12-07 MED ORDER — SACCHAROMYCES BOULARDII 250 MG PO CAPS: 250.0000 mg | ORAL_CAPSULE | Freq: Two times a day (BID) | ORAL | 0 refills | Status: AC

## 2019-12-07 MED ORDER — GUAIFENESIN-CODEINE 100-10 MG/5ML PO SOLN: 5.0000 mL | Freq: Every evening | ORAL | 0 refills | Status: DC | PRN

## 2019-12-07 MED ORDER — AMOXICILLIN-POT CLAVULANATE 875-125 MG PO TABS: 1.0000 | ORAL_TABLET | Freq: Two times a day (BID) | ORAL | 0 refills | Status: DC

## 2019-12-07 NOTE — Progress Notes (Signed)
Virtual Visit via Virtual Note  This visit type was conducted due to national recommendations for restrictions regarding the COVID-19 pandemic (e.g. social distancing).  This format is felt to be most appropriate for this patient at this time.  All issues noted in this document were discussed and addressed.  No physical exam was performed (except for noted visual exam findings with Video Visits).   I connected with@ on 12/09/19 at  2:00 PM EDT by a video enabled telemedicine application or telephone and verified that I am speaking with the correct person using two identifiers. Location patient: home Location provider: work or home office Persons participating in the virtual visit: patient, provider  I discussed the limitations, risks, security and privacy concerns of performing an evaluation and management service by telephone and the availability of in person appointments. I also discussed with the patient that there may be a patient responsible charge related to this service. The patient expressed understanding and agreed to proceed.   Reason for visit: Sinus infection that he gets every year he started about a week ago.  HPI: This is a 34 year old male with history of ADD, anxiety, deviated septum, and  history of sinus infections.  He has had surgery on his sinuses greater than 10 years ago and continues to have problems with a bad sinus infection once or twice a year.   This current sinus infection started about a week or longer.  He has  nasal pressure,  congestion he is blowing out green discharge.  He has frontal and headache, and maxillary pressure. He is not sleeping well at all d/t bad cough at night with sore throat.No fever but positive chills.  He has noted no wheezing, shortness of breath, or chest tightness.  He has been taking Mucinex DM twice daily for a week, Afrin for 2 days,  Nyquil for 2 nights and is getting no rest.  He has chronic insomnia anyway and this is making it  impossible for him to rest.  He is requesting the Cheratussin cough medicine as it is the only cough medicine that knocks him out enough so he can actually sleep through the discomfort.   He does not have a history of allergies, asthma, and cannot pinpoint a trigger for his sinusitis episodes.  He has wondered about ongoing anatomic abnormality in his sinuses.  He had his first [employer] vaccine 12/03/19.  His wife tested negative for Covid recently.  He has not come in contact with anyone who tested positive for Covid that he is aware.  ROS: See pertinent positives and negatives per HPI.  Past Medical History:  Diagnosis Date  . Migraines     Past Surgical History:  Procedure Laterality Date  . broken arm     age 42    No family history on file.  SOCIAL HX: Former smoker- quit 1 year ago.    Current Outpatient Medications:  .  amphetamine-dextroamphetamine (ADDERALL) 10 MG tablet, Take 1 tablet (10 mg total) by mouth 2 (two) times daily., Disp: 60 tablet, Rfl: 0 .  amoxicillin-clavulanate (AUGMENTIN) 875-125 MG tablet, Take 1 tablet by mouth 2 (two) times daily., Disp: 20 tablet, Rfl: 0 .  guaiFENesin-codeine 100-10 MG/5ML syrup, Take 5 mLs by mouth at bedtime as needed for cough., Disp: 50 mL, Rfl: 0 .  saccharomyces boulardii (FLORASTOR) 250 MG capsule, Take 1 capsule (250 mg total) by mouth 2 (two) times daily for 7 days., Disp: 14 capsule, Rfl: 0  EXAM:  VITALS per  patient if applicable:  GENERAL: alert, oriented, appears mildly ill  and in no acute distress  HEENT: atraumatic, conjunctiva clear, no obvious abnormalities on inspection of external nose and ears. Positive congested voice.  NECK: normal movements of the head and neck  LUNGS: On inspection no signs of respiratory distress, breathing rate appears normal, no obvious gross SOB, gasping or wheezing, coughing noted.  CV: no obvious cyanosis  MS: moves all visible extremities without noticeable  abnormality  PSYCH/NEURO: pleasant and cooperative, no obvious depression or anxiety, speech and thought processing grossly intact  ASSESSMENT AND PLAN:  Discussed the following assessment and plan:  Acute recurrent sinusitis, unspecified location - Plan: Ambulatory referral to ENT  We discussed that he needs a  Covid test since he is not  fully vaccinated.  We discussed that sinus infection can be a typical symptom of Covid.  He reports he has had multiple negative Covid tests  and has had no significant contacts.  He was advised that he get tested to his pharmacy when he picks up his medications or he can call the [facility] line  [phone] to make an appointment.  He was given this phone number to write down while we were having the visit.  He is also to self quarantine while waiting for the test result.  Advised to wear a mask when he leaves the house.  He voices understanding.  Referral to ENT with Dr. Jenne Campus.  If you would prefer a different provider when they call to make the appointment you may request whomever you would like.  Augmentin as well as a probiotic called Florastor.  He may choose a different probiotic per preference.  Eating Activia yogurt is also recommended to help prevent diarrhea.I did order a low dose of the codeine guaifenesin cough medicine for bedtime after reviewing  the PDMP.  There is no suspicious activity noted.  The patient was counseled regarding the medication and its sedative effects.  He is not to take it in close proximity with any other controlled substance.  He voices understanding.  He has tolerated this cough medicine very well in the past.   Continue with the Afrin for one day.  When the Afrin is in place in the nose is a little more clear, try the nasal saline spray.  Continue to try to saline spray as your infection improves.  This provides a good decongestant.    Please call back or seek in-person care if symptoms worsen or if the condition fails to  improve as anticipated.  I discussed the assessment and treatment plan with the patient. The patient was provided an opportunity to ask questions and all were answered. The patient agreed with the plan and demonstrated an understanding of the instructions.   The patient was advised to call back or seek an in-person evaluation if the symptoms worsen or if the condition fails to improve as anticipated.  Amedeo Kinsman, NP Adult Nurse Practitioner [facility] [employer] [phone]

## 2019-12-07 NOTE — Patient Instructions (Addendum)
It was nice to meet you today.   I did place referral to ENT with Dr. Jenne Campus.  If you would prefer a different provider when they call to make the appointment you may request whomever you would like.  I did send in a prescription for Augmentin as well as a probiotic called Florastor.  You may choose a different probiotic if you prefer. Eating Activia yogurt is also recommended to help prevent diarrhea.   I did order the codeine guaifenesin cough medicine for bedtime.  Continue with the Afrin for one day.  When the Afrin is in place in the nose is a little more clear, try the nasal saline spray.  Continue to try to saline spray as your infection improves.  This provides a good decongestant.    Please call back or seek in-person care if symptoms worsen or if the condition fails to improve as anticipated.   Sinusitis, Adult Sinusitis is inflammation of your sinuses. Sinuses are hollow spaces in the bones around your face. Your sinuses are located:  Around your eyes.  In the middle of your forehead.  Behind your nose.  In your cheekbones. Mucus normally drains out of your sinuses. When your nasal tissues become inflamed or swollen, mucus can become trapped or blocked. This allows bacteria, viruses, and fungi to grow, which leads to infection. Most infections of the sinuses are caused by a virus. Sinusitis can develop quickly. It can last for up to 4 weeks (acute) or for more than 12 weeks (chronic). Sinusitis often develops after a cold. What are the causes? This condition is caused by anything that creates swelling in the sinuses or stops mucus from draining. This includes:  Allergies.  Asthma.  Infection from bacteria or viruses.  Deformities or blockages in your nose or sinuses.  Abnormal growths in the nose (nasal polyps).  Pollutants, such as chemicals or irritants in the air.  Infection from fungi (rare). What increases the risk? You are more likely to develop this  condition if you:  Have a weak body defense system (immune system).  Do a lot of swimming or diving.  Overuse nasal sprays.  Smoke. What are the signs or symptoms? The main symptoms of this condition are pain and a feeling of pressure around the affected sinuses. Other symptoms include:  Stuffy nose or congestion.  Thick drainage from your nose.  Swelling and warmth over the affected sinuses.  Headache.  Upper toothache.  A cough that may get worse at night.  Extra mucus that collects in the throat or the back of the nose (postnasal drip).  Decreased sense of smell and taste.  Fatigue.  A fever.  Sore throat.  Bad breath. How is this diagnosed? This condition is diagnosed based on:  Your symptoms.  Your medical history.  A physical exam.  Tests to find out if your condition is acute or chronic. This may include: ? Checking your nose for nasal polyps. ? Viewing your sinuses using a device that has a light (endoscope). ? Testing for allergies or bacteria. ? Imaging tests, such as an MRI or CT scan. In rare cases, a bone biopsy may be done to rule out more serious types of fungal sinus disease. How is this treated? Treatment for sinusitis depends on the cause and whether your condition is chronic or acute.  If caused by a virus, your symptoms should go away on their own within 10 days. You may be given medicines to relieve symptoms. They include: ?  Medicines that shrink swollen nasal passages (topical intranasal decongestants). ? Medicines that treat allergies (antihistamines). ? A spray that eases inflammation of the nostrils (topical intranasal corticosteroids). ? Rinses that help get rid of thick mucus in your nose (nasal saline washes).  If caused by bacteria, your health care provider may recommend waiting to see if your symptoms improve. Most bacterial infections will get better without antibiotic medicine. You may be given antibiotics if you have: ? A  severe infection. ? A weak immune system.  If caused by narrow nasal passages or nasal polyps, you may need to have surgery. Follow these instructions at home: Medicines  Take, use, or apply over-the-counter and prescription medicines only as told by your health care provider. These may include nasal sprays.  If you were prescribed an antibiotic medicine, take it as told by your health care provider. Do not stop taking the antibiotic even if you start to feel better. Hydrate and humidify   Drink enough fluid to keep your urine pale yellow. Staying hydrated will help to thin your mucus.  Use a cool mist humidifier to keep the humidity level in your home above 50%.  Inhale steam for 10-15 minutes, 3-4 times a day, or as told by your health care provider. You can do this in the bathroom while a hot shower is running.  Limit your exposure to cool or dry air. Rest  Rest as much as possible.  Sleep with your head raised (elevated).  Make sure you get enough sleep each night. General instructions   Apply a warm, moist washcloth to your face 3-4 times a day or as told by your health care provider. This will help with discomfort.  Wash your hands often with soap and water to reduce your exposure to germs. If soap and water are not available, use hand sanitizer.  Do not smoke. Avoid being around people who are smoking (secondhand smoke).  Keep all follow-up visits as told by your health care provider. This is important. Contact a health care provider if:  You have a fever.  Your symptoms get worse.  Your symptoms do not improve within 10 days. Get help right away if:  You have a severe headache.  You have persistent vomiting.  You have severe pain or swelling around your face or eyes.  You have vision problems.  You develop confusion.  Your neck is stiff.  You have trouble breathing. Summary  Sinusitis is soreness and inflammation of your sinuses. Sinuses are hollow  spaces in the bones around your face.  This condition is caused by nasal tissues that become inflamed or swollen. The swelling traps or blocks the flow of mucus. This allows bacteria, viruses, and fungi to grow, which leads to infection.  If you were prescribed an antibiotic medicine, take it as told by your health care provider. Do not stop taking the antibiotic even if you start to feel better.  Keep all follow-up visits as told by your health care provider. This is important. This information is not intended to replace advice given to you by your health care provider. Make sure you discuss any questions you have with your health care provider. Document Revised: 09/22/2017 Document Reviewed: 09/22/2017 Elsevier Patient Education  2020 [employer].

## 2019-12-09 ENCOUNTER — Encounter: Payer: Self-pay | Admitting: Nurse Practitioner

## 2019-12-09 DIAGNOSIS — J069 Acute upper respiratory infection, unspecified: Secondary | ICD-10-CM | POA: Insufficient documentation

## 2019-12-15 ENCOUNTER — Telehealth: Payer: Self-pay | Admitting: Family

## 2019-12-15 NOTE — Telephone Encounter (Signed)
LVM FOR PT TO CB TO SCHED APPT" [facility] [place] said 7 days ago  "Rejection Reason - Patient did not respond - PT DID NOT CB TO SCHED APPT" [facility] [place] said about 3 hours ago

## 2019-12-17 ENCOUNTER — Telehealth: Payer: Self-pay | Admitting: Family

## 2019-12-17 DIAGNOSIS — F988 Other specified behavioral and emotional disorders with onset usually occurring in childhood and adolescence: Secondary | ICD-10-CM

## 2019-12-17 MED ORDER — AMPHETAMINE-DEXTROAMPHETAMINE 10 MG PO TABS: 10.0000 mg | ORAL_TABLET | Freq: Two times a day (BID) | ORAL | 0 refills | Status: DC

## 2019-12-17 NOTE — Telephone Encounter (Signed)
Pt called in need refill on amphetamine-dextroamphetamine (ADDERALL) 10 MG tablet

## 2019-12-17 NOTE — Telephone Encounter (Signed)
Refill request for adderall, last seen 07-09-19, last filled 07-07-19.  Please advise.

## 2019-12-17 NOTE — Telephone Encounter (Signed)
adderall refilled for one month .  Russell Gutierrez can you document more fully that he has been diagnosed with formal testing?

## 2019-12-20 DIAGNOSIS — M5441 Lumbago with sciatica, right side: Secondary | ICD-10-CM | POA: Diagnosis not present

## 2019-12-20 DIAGNOSIS — M25651 Stiffness of right hip, not elsewhere classified: Secondary | ICD-10-CM | POA: Diagnosis not present

## 2019-12-20 DIAGNOSIS — M9905 Segmental and somatic dysfunction of pelvic region: Secondary | ICD-10-CM | POA: Diagnosis not present

## 2019-12-20 DIAGNOSIS — M9903 Segmental and somatic dysfunction of lumbar region: Secondary | ICD-10-CM | POA: Diagnosis not present

## 2019-12-21 NOTE — Telephone Encounter (Signed)
noted 

## 2019-12-22 NOTE — Telephone Encounter (Signed)
Left message to call back  

## 2019-12-22 NOTE — Telephone Encounter (Signed)
Thanks dr Darrick Huntsman and absolutely   Sarah, Please call pt and schedule virtual visit for ADHD follow up.  Please ask him to fax records of formal testing and diagnosis ahead of visit. I had referred him to [facility] for formal testing as I had thought he didn't have records anymore.  Please confirm where and when he was FORMALLY tested and schedule appt to discuss ALL.

## 2019-12-24 NOTE — Telephone Encounter (Signed)
Left message to call back  

## 2019-12-27 ENCOUNTER — Ambulatory Visit: Payer: BLUE CROSS/BLUE SHIELD

## 2019-12-27 ENCOUNTER — Ambulatory Visit: Payer: BLUE CROSS/BLUE SHIELD | Attending: Internal Medicine

## 2019-12-27 DIAGNOSIS — Z23 Encounter for immunization: Secondary | ICD-10-CM

## 2019-12-27 DIAGNOSIS — M9905 Segmental and somatic dysfunction of pelvic region: Secondary | ICD-10-CM | POA: Diagnosis not present

## 2019-12-27 DIAGNOSIS — M5441 Lumbago with sciatica, right side: Secondary | ICD-10-CM | POA: Diagnosis not present

## 2019-12-27 DIAGNOSIS — M9903 Segmental and somatic dysfunction of lumbar region: Secondary | ICD-10-CM | POA: Diagnosis not present

## 2019-12-27 DIAGNOSIS — M25651 Stiffness of right hip, not elsewhere classified: Secondary | ICD-10-CM | POA: Diagnosis not present

## 2019-12-27 NOTE — Telephone Encounter (Signed)
LMTCB

## 2019-12-27 NOTE — Progress Notes (Signed)
   Covid-19 Vaccination Clinic  Name:  MESIAH MANZO    MRN: [id number] DOB: 1985-12-06  12/27/2019  Mr. Gladwell was observed post Covid-19 immunization for 15 minutes without incident. He was provided with Vaccine Information Sheet and instruction to access the V-Safe system.   Mr. Madariaga was instructed to call 911 with any severe reactions post vaccine: Marland Kitchen Difficulty breathing  . Swelling of face and throat  . A fast heartbeat  . A bad rash all over body  . Dizziness and weakness   Immunizations Administered    Name Date Dose VIS Date Route   Pfizer COVID-19 Vaccine 12/27/2019  2:58 PM 0.3 mL 06/30/2018 Intramuscular   Manufacturer: [employer], [employer]   Lot: [id number]   NDC: [address]-5

## 2020-01-20 DIAGNOSIS — F411 Generalized anxiety disorder: Secondary | ICD-10-CM | POA: Diagnosis not present

## 2020-01-20 DIAGNOSIS — F900 Attention-deficit hyperactivity disorder, predominantly inattentive type: Secondary | ICD-10-CM | POA: Diagnosis not present

## 2020-01-24 DIAGNOSIS — F900 Attention-deficit hyperactivity disorder, predominantly inattentive type: Secondary | ICD-10-CM | POA: Diagnosis not present

## 2020-01-24 DIAGNOSIS — F411 Generalized anxiety disorder: Secondary | ICD-10-CM | POA: Diagnosis not present

## 2020-01-27 ENCOUNTER — Telehealth: Payer: Self-pay | Admitting: Family

## 2020-01-27 NOTE — Telephone Encounter (Signed)
Fine to use HFU

## 2020-01-27 NOTE — Telephone Encounter (Signed)
You have nothing available but a few hospital f/u's & same days. Would you want me to use one of those?

## 2020-01-27 NOTE — Telephone Encounter (Signed)
Pt needs a refill on amphetamine-dextroamphetamine (ADDERALL) 10 [facility] tablet [facility]  

## 2020-01-27 NOTE — Telephone Encounter (Signed)
Scheduled pt for 9/28 @ 9:00

## 2020-01-27 NOTE — Telephone Encounter (Signed)
Patient does not want the appointment for the 10-25 want to know if Russell Gutierrez could do something else

## 2020-01-27 NOTE — Telephone Encounter (Signed)
LM for patient to call back so I could schedule him sooner in hospital f/u.

## 2020-01-27 NOTE — Telephone Encounter (Signed)
Call pt We tried to call him one month ago regarding the below  He has no upcoming appts and I have not seen him since march I dont feel comfortable refillling without formal records in chart or recent followup . Policy is to be seen every 3 months Please sch f/u so we can review controlled sub policy; it will need to be in person so we can get urine as well   note from 12/15/19 Maralyn Sago, Please call pt and schedule virtual visit for ADHD follow up.  Please ask him to fax records of formal testing and diagnosis ahead of visit. I had referred him to [facility] for formal testing as I had thought he didn't have records anymore.  Please confirm where and when he was FORMALLY tested and schedule appt to discuss ALL

## 2020-01-27 NOTE — Telephone Encounter (Signed)
LMTCB informed in message we could not refill since he had not been seem by Claris Che since March. He needs to make f/u & I asked that if he had formal documentation for dx of ADHD that we needed to have scanned into chart.

## 2020-01-28 DIAGNOSIS — F900 Attention-deficit hyperactivity disorder, predominantly inattentive type: Secondary | ICD-10-CM | POA: Diagnosis not present

## 2020-01-28 DIAGNOSIS — F411 Generalized anxiety disorder: Secondary | ICD-10-CM | POA: Diagnosis not present

## 2020-01-31 DIAGNOSIS — F411 Generalized anxiety disorder: Secondary | ICD-10-CM | POA: Diagnosis not present

## 2020-01-31 DIAGNOSIS — F900 Attention-deficit hyperactivity disorder, predominantly inattentive type: Secondary | ICD-10-CM | POA: Diagnosis not present

## 2020-02-01 ENCOUNTER — Ambulatory Visit (INDEPENDENT_AMBULATORY_CARE_PROVIDER_SITE_OTHER): Payer: BLUE CROSS/BLUE SHIELD | Admitting: Family

## 2020-02-01 ENCOUNTER — Telehealth: Payer: Self-pay | Admitting: Family

## 2020-02-01 ENCOUNTER — Other Ambulatory Visit: Payer: Self-pay

## 2020-02-01 ENCOUNTER — Encounter: Payer: Self-pay | Admitting: Family

## 2020-02-01 DIAGNOSIS — K824 Cholesterolosis of gallbladder: Secondary | ICD-10-CM | POA: Diagnosis not present

## 2020-02-01 DIAGNOSIS — F988 Other specified behavioral and emotional disorders with onset usually occurring in childhood and adolescence: Secondary | ICD-10-CM | POA: Diagnosis not present

## 2020-02-01 LAB — HEPATIC FUNCTION PANEL
ALT: 16 U/L (ref 0–53)
AST: 18 U/L (ref 0–37)
Albumin: 4.7 g/dL (ref 3.5–5.2)
Alkaline Phosphatase: 41 U/L (ref 39–117)
Bilirubin, Direct: 0.4 mg/dL — ABNORMAL HIGH (ref 0.0–0.3)
Total Bilirubin: 2.6 mg/dL — ABNORMAL HIGH (ref 0.2–1.2)
Total Protein: 7.3 g/dL (ref 6.0–8.3)

## 2020-02-01 MED ORDER — AMPHETAMINE-DEXTROAMPHETAMINE 10 MG PO TABS: 10.0000 mg | ORAL_TABLET | Freq: Two times a day (BID) | ORAL | 0 refills | Status: DC

## 2020-02-01 NOTE — Progress Notes (Signed)
Subjective:    Patient ID: Russell Gutierrez, male    DOB: [birth year], 34 y.o.   MRN: [id number]  CC: Russell Gutierrez is a 34 y.o. male who presents today for follow up.   HPI: Feels well today No complaints  ADHD follow up   Takes adderall 10mg  twice per day, some days only once on days he is working. He doesn't need medication  on the weekends.   Diagnosed with ADHD in second grade , thinks by pediatrician. Has been on stimulant medication since that time.Had appointment scheduled, needs to reschedule with [facility] psychology for re-evaluation of ADHD as he doesn't have paperwork with formal testing.   No trouble sleeping, increased anxiety, palpitations, chest pain No personal or family history of arrhythmia. No family h/o scd.  No depression No h/o drug over dose, addiction.  No SI/HI.  H/o gallbadder polyp.     h/o elevated bilirubin  HISTORY:  Past Medical History:  Diagnosis Date  . Migraines    Past Surgical History:  Procedure Laterality Date  . broken arm     age 49   Family History  Problem Relation Age of Onset  . Arrhythmia Neg Hx     Allergies: Patient has no known allergies. No current outpatient medications on file prior to visit.   No current facility-administered medications on file prior to visit.    Social History   Tobacco Use  . Smoking status: Former 12  . Smokeless tobacco: Never Used  Vaping Use  . Vaping Use: Never used  Substance Use Topics  . Alcohol use: Yes    Comment: glass of wine here or there.   . Drug use: Never    Review of Systems  Constitutional: Negative for chills and fever.  Respiratory: Negative for cough.   Cardiovascular: Negative for chest pain and palpitations.  Gastrointestinal: Negative for nausea and vomiting.      Objective:    BP 122/80   Pulse 80   Temp 97.9 F (36.6 C)   Ht 6' (1.829 m)   Wt 154 lb (69.9 kg)   SpO2 99%   BMI 20.89 kg/m  BP Readings from Last 3 Encounters:    02/01/20 122/80  06/08/18 120/84  05/29/18 110/68   Wt Readings from Last 3 Encounters:  02/01/20 154 lb (69.9 kg)  12/07/19 155 lb (70.3 kg)  07/09/19 155 lb (70.3 kg)    Physical Exam Vitals reviewed.  Constitutional:      Appearance: He is well-developed.  Cardiovascular:     Rate and Rhythm: Regular rhythm.     Heart sounds: Normal heart sounds.  Pulmonary:     Effort: Pulmonary effort is normal. No respiratory distress.     Breath sounds: Normal breath sounds. No wheezing, rhonchi or rales.  Skin:    General: Skin is warm and dry.  Neurological:     Mental Status: He is alert.  Psychiatric:        Speech: Speech normal.        Behavior: Behavior normal.        Assessment & Plan:   Problem List Items Addressed This Visit      Digestive   Gallbladder polyp    12mm gallbladder polyp. Pending 11m and repeat hepatic function panel with h/o elevated bilirubin. Will place referral to GI after we have results.       Relevant Orders   Hepatic function panel   US ABDOMEN LIMITED RUQ  Other   Attention deficit disorder (ADD) in adult    Stable on current regimen. No adverse effects at this time. Will await formal testing again since he cannot find record when tested as a child. Will continue adderall while we await to be testing. Urine drug screen today and CSC signed.       Relevant Medications   amphetamine-dextroamphetamine (ADDERALL) 10 MG tablet   amphetamine-dextroamphetamine (ADDERALL) 10 MG tablet   amphetamine-dextroamphetamine (ADDERALL) 10 MG tablet   Other Relevant Orders   Pain Management Screening Profile (10S)       I have discontinued Oran Rein. Bouler's amoxicillin-clavulanate and guaiFENesin-codeine. I am also having him start on amphetamine-dextroamphetamine and amphetamine-dextroamphetamine. Additionally, I am having him maintain his amphetamine-dextroamphetamine.   Meds ordered this encounter  Medications  .  amphetamine-dextroamphetamine (ADDERALL) 10 MG tablet    Sig: Take 1 tablet (10 mg total) by mouth 2 (two) times daily.    Dispense:  60 tablet    Refill:  0    DNF 04/02/20    Order Specific Question:   Supervising Provider    Answer:   Duncan Dull L [[id number]]  . amphetamine-dextroamphetamine (ADDERALL) 10 MG tablet    Sig: Take 1 tablet (10 mg total) by mouth 2 (two) times daily.    Dispense:  60 tablet    Refill:  0    DNF 03/02/20    Order Specific Question:   Supervising Provider    Answer:   Duncan Dull L [[id number]]  . amphetamine-dextroamphetamine (ADDERALL) 10 MG tablet    Sig: Take 1 tablet (10 mg total) by mouth 2 (two) times daily.    Dispense:  60 tablet    Refill:  0    Order Specific Question:   Supervising Provider    Answer:   Sherlene Shams [[id number]]    Return precautions given.   Risks, benefits, and alternatives of the medications and treatment plan prescribed today were discussed, and patient expressed understanding.   Education regarding symptom management and diagnosis given to patient on AVS.  Continue to follow with Allegra Grana, FNP for routine health maintenance.   Russell Gutierrez and I agreed with plan.   [name], FNP

## 2020-02-01 NOTE — Assessment & Plan Note (Signed)
Stable on current regimen. No adverse effects at this time. Will await formal testing again since he cannot find record when tested as a child. Will continue adderall while we await to be testing. Urine drug screen today and CSC signed.

## 2020-02-01 NOTE — Patient Instructions (Signed)
Ensure you do make an appointment to be formally tested for ADHD. We will continue your medications until then  Referral for ultrasound of liver/gallbladder  Stay safe!

## 2020-02-01 NOTE — Assessment & Plan Note (Signed)
34mm gallbladder polyp. Pending [place] and repeat hepatic function panel with h/o elevated bilirubin. Will place referral to GI after we have results.

## 2020-02-01 NOTE — Telephone Encounter (Signed)
lft vm for pt to call ofc to sch US 

## 2020-02-04 LAB — PMP SCREEN PROFILE (10S), URINE
Amphetamine Scrn, Ur: NEGATIVE ng/mL
BARBITURATE SCREEN URINE: NEGATIVE ng/mL
BENZODIAZEPINE SCREEN, URINE: NEGATIVE ng/mL
CANNABINOIDS UR QL SCN: POSITIVE ng/mL — AB
Cocaine (Metab) Scrn, Ur: NEGATIVE ng/mL
Creatinine(Crt), U: 43.2 mg/dL (ref 20.0–300.0)
Methadone Screen, Urine: NEGATIVE ng/mL
OXYCODONE+OXYMORPHONE UR QL SCN: NEGATIVE ng/mL
Opiate Scrn, Ur: NEGATIVE ng/mL
Ph of Urine: 8.2 (ref 4.5–8.9)
Phencyclidine Qn, Ur: NEGATIVE ng/mL
Propoxyphene Scrn, Ur: NEGATIVE ng/mL

## 2020-02-07 DIAGNOSIS — F411 Generalized anxiety disorder: Secondary | ICD-10-CM | POA: Diagnosis not present

## 2020-02-07 DIAGNOSIS — F900 Attention-deficit hyperactivity disorder, predominantly inattentive type: Secondary | ICD-10-CM | POA: Diagnosis not present

## 2020-02-11 ENCOUNTER — Other Ambulatory Visit: Payer: Self-pay | Admitting: Family

## 2020-02-11 ENCOUNTER — Telehealth: Payer: Self-pay | Admitting: Family

## 2020-02-11 DIAGNOSIS — R17 Unspecified jaundice: Secondary | ICD-10-CM

## 2020-02-11 NOTE — Telephone Encounter (Signed)
Noted  

## 2020-02-11 NOTE — Telephone Encounter (Signed)
FYI sarah   Called pt to review labs  Please give phone to me  If you would however sch him for non fasting labs in the next couple of weeks, I want to check his liver enzymes as his bilirubin is elevated. 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

## 2020-02-14 ENCOUNTER — Ambulatory Visit: Payer: BLUE CROSS/BLUE SHIELD

## 2020-02-16 DIAGNOSIS — F411 Generalized anxiety disorder: Secondary | ICD-10-CM | POA: Diagnosis not present

## 2020-02-16 DIAGNOSIS — F900 Attention-deficit hyperactivity disorder, predominantly inattentive type: Secondary | ICD-10-CM | POA: Diagnosis not present

## 2020-02-18 ENCOUNTER — Encounter: Payer: Self-pay | Admitting: Family

## 2020-02-18 ENCOUNTER — Ambulatory Visit
Admission: RE | Admit: 2020-02-18 | Discharge: 2020-02-18 | Disposition: A | Discharge status: Home / Self Care | Payer: BLUE CROSS/BLUE SHIELD | Source: Ambulatory Visit | Attending: Family | Admitting: Family

## 2020-02-18 ENCOUNTER — Other Ambulatory Visit: Payer: Self-pay

## 2020-02-18 DIAGNOSIS — F988 Other specified behavioral and emotional disorders with onset usually occurring in childhood and adolescence: Secondary | ICD-10-CM

## 2020-02-18 DIAGNOSIS — K824 Cholesterolosis of gallbladder: Secondary | ICD-10-CM | POA: Diagnosis not present

## 2020-02-18 DIAGNOSIS — R7989 Other specified abnormal findings of blood chemistry: Secondary | ICD-10-CM | POA: Diagnosis not present

## 2020-02-18 NOTE — Telephone Encounter (Signed)
Please sch patient a morning lab on a Friday in a couple of weeks He said you can send him a [employer]

## 2020-02-18 NOTE — Telephone Encounter (Signed)
Patient called & advised that mychartmessage had been sent for him to read. He is also scheduled for labs on 10/29.

## 2020-02-18 NOTE — Telephone Encounter (Signed)
Spoke with pt at length about positive cannabinoid in urine. He has been vaping and eating gummy's from Delta 8 purchased in a hemp store in [place]. His counselor advised him to start using delta 8 when he was attempting smoking cessation. He doesn't need them for anxiety. If he had known that they were dangerous or not acceptable in order to take adderall, he would have stopped immediately and easily. He doesn't feel addicted to the substance.   Advised that I would consult with pharmacist, Catie Feliz Beam and my supervising MD, Dr Darrick Huntsman  After discussing with both, I am keenly aware of risks of medication per FDA and its psychoactive properties. I do not feel comfortable in prescribing adderall if he is using delta 8. This is not an illicit substance and patient was advised to take medication by a counselor.  Patient is very agreeable to immediately stopping medication and aware of health risks if continued.  I have decided to give him a second chance with repeat urine screen in 2 weeks and again in 3 months. I do not have concerns of abuse, addiction , or recklessness. Patient is aware of risk of harm and even death if taking psychoactive medication. I have sent him a mychart message as well with article and my decision.

## 2020-02-25 DIAGNOSIS — F411 Generalized anxiety disorder: Secondary | ICD-10-CM | POA: Diagnosis not present

## 2020-02-25 DIAGNOSIS — F900 Attention-deficit hyperactivity disorder, predominantly inattentive type: Secondary | ICD-10-CM | POA: Diagnosis not present

## 2020-02-28 DIAGNOSIS — F900 Attention-deficit hyperactivity disorder, predominantly inattentive type: Secondary | ICD-10-CM | POA: Diagnosis not present

## 2020-02-28 DIAGNOSIS — F411 Generalized anxiety disorder: Secondary | ICD-10-CM | POA: Diagnosis not present

## 2020-03-03 ENCOUNTER — Other Ambulatory Visit: Payer: BLUE CROSS/BLUE SHIELD

## 2020-03-06 DIAGNOSIS — F900 Attention-deficit hyperactivity disorder, predominantly inattentive type: Secondary | ICD-10-CM | POA: Diagnosis not present

## 2020-03-06 DIAGNOSIS — F411 Generalized anxiety disorder: Secondary | ICD-10-CM | POA: Diagnosis not present

## 2020-03-13 DIAGNOSIS — F900 Attention-deficit hyperactivity disorder, predominantly inattentive type: Secondary | ICD-10-CM | POA: Diagnosis not present

## 2020-03-13 DIAGNOSIS — F411 Generalized anxiety disorder: Secondary | ICD-10-CM | POA: Diagnosis not present

## 2020-03-20 DIAGNOSIS — F411 Generalized anxiety disorder: Secondary | ICD-10-CM | POA: Diagnosis not present

## 2020-03-20 DIAGNOSIS — F900 Attention-deficit hyperactivity disorder, predominantly inattentive type: Secondary | ICD-10-CM | POA: Diagnosis not present

## 2020-03-22 ENCOUNTER — Telehealth: Payer: Self-pay | Admitting: Family

## 2020-03-22 NOTE — Telephone Encounter (Signed)
Pt needs a refill on amphetamine-dextroamphetamine (ADDERALL) 10 MG tablet sent to [facility] on [address]

## 2020-03-24 NOTE — Telephone Encounter (Signed)
LMTCB

## 2020-03-24 NOTE — Telephone Encounter (Signed)
Call pt Patient never had hepatic function and urine repeated Please sch He has one refill left of adderall Please call  Pharmacy first  We will see him in January  I looked up patient on [place] Controlled Substances Reporting System PMP AWARE and saw no activity that raised concern of inappropriate use.

## 2020-04-06 NOTE — Telephone Encounter (Signed)
LM once again for patient to call back so that I could get him scheduled for repeat lab. Pt does have appointment 05/15/20.

## 2020-04-21 DIAGNOSIS — M9903 Segmental and somatic dysfunction of lumbar region: Secondary | ICD-10-CM | POA: Diagnosis not present

## 2020-04-21 DIAGNOSIS — M9902 Segmental and somatic dysfunction of thoracic region: Secondary | ICD-10-CM | POA: Diagnosis not present

## 2020-04-21 DIAGNOSIS — M9906 Segmental and somatic dysfunction of lower extremity: Secondary | ICD-10-CM | POA: Diagnosis not present

## 2020-04-21 DIAGNOSIS — M9901 Segmental and somatic dysfunction of cervical region: Secondary | ICD-10-CM | POA: Diagnosis not present

## 2020-04-24 DIAGNOSIS — F411 Generalized anxiety disorder: Secondary | ICD-10-CM | POA: Diagnosis not present

## 2020-04-24 DIAGNOSIS — F900 Attention-deficit hyperactivity disorder, predominantly inattentive type: Secondary | ICD-10-CM | POA: Diagnosis not present

## 2020-04-25 DIAGNOSIS — M9902 Segmental and somatic dysfunction of thoracic region: Secondary | ICD-10-CM | POA: Diagnosis not present

## 2020-04-25 DIAGNOSIS — M9906 Segmental and somatic dysfunction of lower extremity: Secondary | ICD-10-CM | POA: Diagnosis not present

## 2020-04-25 DIAGNOSIS — M9903 Segmental and somatic dysfunction of lumbar region: Secondary | ICD-10-CM | POA: Diagnosis not present

## 2020-04-25 DIAGNOSIS — M9901 Segmental and somatic dysfunction of cervical region: Secondary | ICD-10-CM | POA: Diagnosis not present

## 2020-04-26 DIAGNOSIS — M9903 Segmental and somatic dysfunction of lumbar region: Secondary | ICD-10-CM | POA: Diagnosis not present

## 2020-04-26 DIAGNOSIS — M9906 Segmental and somatic dysfunction of lower extremity: Secondary | ICD-10-CM | POA: Diagnosis not present

## 2020-04-26 DIAGNOSIS — M9901 Segmental and somatic dysfunction of cervical region: Secondary | ICD-10-CM | POA: Diagnosis not present

## 2020-04-26 DIAGNOSIS — M9902 Segmental and somatic dysfunction of thoracic region: Secondary | ICD-10-CM | POA: Diagnosis not present

## 2020-05-10 ENCOUNTER — Telehealth: Payer: Self-pay | Admitting: Family

## 2020-05-10 DIAGNOSIS — F988 Other specified behavioral and emotional disorders with onset usually occurring in childhood and adolescence: Secondary | ICD-10-CM

## 2020-05-10 NOTE — Telephone Encounter (Signed)
Pt needs a refill on amphetamine-dextroamphetamine (ADDERALL) 10 MG tablet sent to [facility]

## 2020-05-11 MED ORDER — AMPHETAMINE-DEXTROAMPHETAMINE 10 MG PO TABS
10.0000 mg | ORAL_TABLET | Freq: Two times a day (BID) | ORAL | 0 refills | Status: DC
Start: 2020-05-11 — End: 2020-07-28

## 2020-05-11 MED ORDER — AMPHETAMINE-DEXTROAMPHETAMINE 10 MG PO TABS: 10.0000 mg | ORAL_TABLET | Freq: Two times a day (BID) | ORAL | 0 refills | Status: DC

## 2020-05-11 NOTE — Addendum Note (Signed)
Addended by: Allegra Grana on: 05/11/2020 10:46 AM   Modules accepted: Orders

## 2020-05-11 NOTE — Telephone Encounter (Signed)
Call pt Advise to request future refills by logging onto mychart and requesting medication refill This is more direct, and he gets refill quicker  I looked up patient on [place] Controlled Substances Reporting System PMP AWARE and saw no activity that raised concern of inappropriate use.

## 2020-05-15 ENCOUNTER — Ambulatory Visit: Payer: BLUE CROSS/BLUE SHIELD | Admitting: Family

## 2020-05-15 DIAGNOSIS — Z0289 Encounter for other administrative examinations: Secondary | ICD-10-CM

## 2020-06-23 ENCOUNTER — Other Ambulatory Visit: Payer: Self-pay

## 2020-06-23 ENCOUNTER — Ambulatory Visit (INDEPENDENT_AMBULATORY_CARE_PROVIDER_SITE_OTHER): Payer: BLUE CROSS/BLUE SHIELD | Admitting: Family

## 2020-06-23 ENCOUNTER — Encounter: Payer: Self-pay | Admitting: Family

## 2020-06-23 VITALS — BP 120/85 | HR 84 | Temp 97.7°F | Ht 72.01 in | Wt 147.2 lb

## 2020-06-23 DIAGNOSIS — F988 Other specified behavioral and emotional disorders with onset usually occurring in childhood and adolescence: Secondary | ICD-10-CM | POA: Diagnosis not present

## 2020-06-23 DIAGNOSIS — K824 Cholesterolosis of gallbladder: Secondary | ICD-10-CM | POA: Diagnosis not present

## 2020-06-23 DIAGNOSIS — R17 Unspecified jaundice: Secondary | ICD-10-CM

## 2020-06-23 LAB — COMPREHENSIVE METABOLIC PANEL
ALT: 14 U/L (ref 0–53)
AST: 16 U/L (ref 0–37)
Albumin: 4.6 g/dL (ref 3.5–5.2)
Alkaline Phosphatase: 50 U/L (ref 39–117)
BUN: 19 mg/dL (ref 6–23)
CO2: 30 mEq/L (ref 19–32)
Calcium: 9.8 mg/dL (ref 8.4–10.5)
Chloride: 103 mEq/L (ref 96–112)
Creatinine, Ser: 0.89 mg/dL (ref 0.40–1.50)
GFR: 110.91 mL/min (ref >60.00)
Glucose, Bld: 86 mg/dL (ref 70–99)
Potassium: 4.1 mEq/L (ref 3.5–5.1)
Sodium: 138 mEq/L (ref 135–145)
Total Bilirubin: 2.2 mg/dL — ABNORMAL HIGH (ref 0.2–1.2)
Total Protein: 7.2 g/dL (ref 6.0–8.3)

## 2020-06-23 LAB — GAMMA GT: GGT: 19 U/L (ref 7–51)

## 2020-06-23 NOTE — Assessment & Plan Note (Addendum)
Long discussion at it relates to Delta 8 CBD gummy and urine will likely reflect this. This product is not illegal and purchased in [employer]. I discussed my concern for psychoactive properties of which patient denies that he has any; he feel medication calms anxiety, promotes sleep  And has aided in his ability for smoking cessation. I counseled on safety and advised that he may take adderall in the morning and he may not take Delta 8 CBD gummy while he has taken his adderall in the day. Advised strict separation of products and counseled on not driving on Delta 8 at any point. Advised that FDA has not approved the safety of any Delta 8 products and storage of both adderall and delta 8 away from children, pets extremely important.  He verbalized understanding and acknowledged the risk of CBD , known and unknown, and again stated he would not use adderall as prescribed by me with Delta 8 at any point or I will no longer prescribe adderall. Follow up in 3 months.  I looked up patient on [place] Controlled Substances Reporting System PMP AWARE and saw no activity that raised concern of inappropriate use.

## 2020-06-23 NOTE — Assessment & Plan Note (Signed)
Normal liver ultrasound. Pending hepatic panel.

## 2020-06-23 NOTE — Progress Notes (Signed)
Subjective:    Patient ID: Russell Gutierrez, male    DOB: 1985-06-06, 35 y.o.   MRN: [id number]  CC: Russell Gutierrez is a 35 y.o. male who presents today for follow up.   HPI: Feels well today  No new complaints.   He continues to take a Delta 8 CBD gummy at night to help with relaxation and promote sleep as recommended by his counselor. He originally started to help him quit smoking which he has. He doesn't feel psychoactive properties on this medication. He doesn't drive on Delta 8.  Rare alcohol use. No depression. He doesn't smoke marijuana.  No other drug use.   Feels well on adderall 10mg  and feels adequate dose. No increased anxiety from this medication.   He walks his dog daily. No cp, sob.      HISTORY:  Past Medical History:  Diagnosis Date  . Migraines    Past Surgical History:  Procedure Laterality Date  . broken arm     age 87   Family History  Problem Relation Age of Onset  . Arrhythmia Neg Hx     Allergies: Patient has no known allergies. Current Outpatient Medications on File Prior to Visit  Medication Sig Dispense Refill  . amphetamine-dextroamphetamine (ADDERALL) 10 MG tablet Take 1 tablet (10 mg total) by mouth 2 (two) times daily. 60 tablet 0  . amphetamine-dextroamphetamine (ADDERALL) 10 MG tablet Take 1 tablet (10 mg total) by mouth 2 (two) times daily. 60 tablet 0  . amphetamine-dextroamphetamine (ADDERALL) 10 MG tablet Take 1 tablet (10 mg total) by mouth 2 (two) times daily. 60 tablet 0   No current facility-administered medications on file prior to visit.    Social History   Tobacco Use  . Smoking status: Former 12  . Smokeless tobacco: Never Used  Vaping Use  . Vaping Use: Never used  Substance Use Topics  . Alcohol use: Yes    Comment: glass of wine here or there.   . Drug use: Never    Review of Systems  Constitutional: Negative for chills and fever.  Respiratory: Negative for cough.   Cardiovascular: Negative for chest  pain and palpitations.  Gastrointestinal: Negative for nausea and vomiting.  Psychiatric/Behavioral: Negative for confusion and sleep disturbance. The patient is not nervous/anxious.       Objective:    BP 120/85   Pulse 84   Temp 97.7 F (36.5 C)   Ht 6' 0.01" (1.829 m)   Wt 147 lb 3.2 oz (66.8 kg)   SpO2 99%   BMI 19.96 kg/m  BP Readings from Last 3 Encounters:  06/23/20 120/85  02/01/20 122/80  06/08/18 120/84   Wt Readings from Last 3 Encounters:  06/23/20 147 lb 3.2 oz (66.8 kg)  02/01/20 154 lb (69.9 kg)  12/07/19 155 lb (70.3 kg)    Physical Exam Vitals reviewed.  Constitutional:      Appearance: He is well-developed and well-nourished.  Cardiovascular:     Rate and Rhythm: Regular rhythm.     Heart sounds: Normal heart sounds.  Pulmonary:     Effort: Pulmonary effort is normal. No respiratory distress.     Breath sounds: Normal breath sounds. No wheezing, rhonchi or rales.  Skin:    General: Skin is warm and dry.  Neurological:     Mental Status: He is alert.  Psychiatric:        Mood and Affect: Mood and affect normal.        Speech:  Speech normal.        Behavior: Behavior normal.        Assessment & Plan:   Problem List Items Addressed This Visit      Digestive   Gallbladder polyp     Other   Attention deficit disorder (ADD) in adult - Primary    Long discussion at it relates to Delta 8 CBD gummy and urine will likely reflect this. This product is not illegal and purchased in [employer]. I discussed my concern for psychoactive properties of which patient denies that he has any; he feel medication calms anxiety, promotes sleep  And has aided in his ability for smoking cessation. I counseled on safety and advised that he may take adderall in the morning and he may not take Delta 8 CBD gummy while he has taken his adderall in the day. Advised strict separation of products and counseled on not driving on Delta 8 at any point. Advised that  FDA has not approved the safety of any Delta 8 products and storage of both adderall and delta 8 away from children, pets extremely important.  He verbalized understanding and acknowledged the risk of CBD , known and unknown, and again stated he would not use adderall as prescribed by me with Delta 8 at any point or I will no longer prescribe adderall. Follow up in 3 months.  I looked up patient on [place] Controlled Substances Reporting System PMP AWARE and saw no activity that raised concern of inappropriate use.        Relevant Orders   Pain Management Screening Profile (10S)   Elevated bilirubin    Normal liver ultrasound. Pending hepatic panel.       Relevant Orders   Comprehensive metabolic panel   Gamma GT       I am having Oran Rein. Kneisel maintain his amphetamine-dextroamphetamine, amphetamine-dextroamphetamine, and amphetamine-dextroamphetamine.   No orders of the defined types were placed in this encounter.   Return precautions given.   Risks, benefits, and alternatives of the medications and treatment plan prescribed today were discussed, and patient expressed understanding.   Education regarding symptom management and diagnosis given to patient on AVS.  Continue to follow with Allegra Grana, FNP for routine health maintenance.   Russell Gutierrez and I agreed with plan.   [name], FNP

## 2020-06-23 NOTE — Patient Instructions (Signed)
As we discussed at length today, please do take Delta 8 gummies when you take adderall as I want you to be safe. No driving when you take Delta 8. If you begin to experience any psychoactive effects beyond feeling calm , please stop Detla 8 immediately.   We will continue to discuss this at future visits.

## 2020-06-25 LAB — PMP SCREEN PROFILE (10S), URINE
Amphetamine Scrn, Ur: NEGATIVE ng/mL
BARBITURATE SCREEN URINE: NEGATIVE ng/mL
BENZODIAZEPINE SCREEN, URINE: NEGATIVE ng/mL
CANNABINOIDS UR QL SCN: POSITIVE ng/mL — AB
Cocaine (Metab) Scrn, Ur: NEGATIVE ng/mL
Creatinine(Crt), U: 42.8 mg/dL (ref 20.0–300.0)
Methadone Screen, Urine: NEGATIVE ng/mL
OXYCODONE+OXYMORPHONE UR QL SCN: NEGATIVE ng/mL
Opiate Scrn, Ur: NEGATIVE ng/mL
Ph of Urine: 6.8 (ref 4.5–8.9)
Phencyclidine Qn, Ur: NEGATIVE ng/mL
Propoxyphene Scrn, Ur: NEGATIVE ng/mL

## 2020-06-26 ENCOUNTER — Other Ambulatory Visit: Payer: Self-pay | Admitting: Family

## 2020-06-26 ENCOUNTER — Encounter: Payer: Self-pay | Admitting: Family

## 2020-06-26 DIAGNOSIS — R17 Unspecified jaundice: Secondary | ICD-10-CM

## 2020-07-25 ENCOUNTER — Telehealth: Payer: Self-pay | Admitting: Family

## 2020-07-25 DIAGNOSIS — F988 Other specified behavioral and emotional disorders with onset usually occurring in childhood and adolescence: Secondary | ICD-10-CM

## 2020-07-25 NOTE — Telephone Encounter (Signed)
Patient called in for refill for  amphetamine-dextroamphetamine (ADDERALL) 10 MG tablet  amphetamine-dextroamphetamine (ADDERALL) 10 MG tablet  amphetamine-dextroamphetamine (ADDERALL) 10 MG tablet

## 2020-07-28 MED ORDER — AMPHETAMINE-DEXTROAMPHETAMINE 10 MG PO TABS: 10.0000 mg | ORAL_TABLET | Freq: Two times a day (BID) | ORAL | 0 refills | Status: DC

## 2020-07-28 MED ORDER — AMPHETAMINE-DEXTROAMPHETAMINE 10 MG PO TABS
10.0000 mg | ORAL_TABLET | Freq: Two times a day (BID) | ORAL | 0 refills | Status: DC
Start: 2020-07-28 — End: 2020-12-08

## 2020-07-28 NOTE — Telephone Encounter (Signed)
Call pt  Please sch non fasting lab for bilirubin; he never returned for this in the next 1-2 weeks  Advise requests for controlled substance through pharmacy or mychart refill request ONE week in advance so no delays in prescription. If patient call office  for refill there can be delays due to call volume and doesn't go to refill inbox.   I looked up patient on [place] Controlled Substances Reporting System PMP AWARE and saw no activity that raised concern of inappropriate use.

## 2020-07-31 NOTE — Telephone Encounter (Signed)
LMTCB

## 2020-09-22 ENCOUNTER — Ambulatory Visit: Payer: BLUE CROSS/BLUE SHIELD | Admitting: Family

## 2020-09-22 DIAGNOSIS — Z0289 Encounter for other administrative examinations: Secondary | ICD-10-CM

## 2020-10-30 ENCOUNTER — Telehealth: Payer: Self-pay | Admitting: Family

## 2020-10-30 NOTE — Telephone Encounter (Signed)
FYI I called and spoke with patient's wife Tresa Endo. She stated that patient was in the bed with body aches, fatigue & chills. Very little coughing or any other symptoms. No SOB, congestion, no HA, n/v or diarrhea. He is taking tylenol every 6 hours. I advised UC & patients's wife stated that he would not go. I suggested they try the e-visit option through mychart. I told her I was not sure what would e prescribed. I advised that if worsening symptoms, if patient's fever were to get any higher, if he developed cough w/ SOB to please take patient to ED. Pt's wife verbalized understanding & I ask she update [place] on patient's condition. NO appointments with any provider in office this week at this time.

## 2020-10-30 NOTE — Telephone Encounter (Signed)
Noted will wait for triage notes.

## 2020-10-30 NOTE — Telephone Encounter (Signed)
Nurse Line called back to advise that the PT wife advise that before they had ended the call they had they had asked about paxlovid and was wanting to relay this info to [place]. PT wife was wanting to see if the PT can be prescribed paxlovid for covid.

## 2020-10-30 NOTE — Telephone Encounter (Signed)
Transfer to Access Nurse  PT wife called to advise that husband tested positive for covid yesterday at 16 and since then has been running a fever of 101 to 102. They would like to have some meds called in for this if possible. Transfer to Access Nurse to advise no apts available and to assist.

## 2020-11-01 ENCOUNTER — Telehealth: Payer: Self-pay | Admitting: Family

## 2020-11-01 DIAGNOSIS — F988 Other specified behavioral and emotional disorders with onset usually occurring in childhood and adolescence: Secondary | ICD-10-CM

## 2020-11-01 NOTE — Telephone Encounter (Signed)
FYI patient informed of below & scheduled for 7/27. He knows he has to keep appointments every 3 months for refills or he will be weaned off.

## 2020-11-01 NOTE — Telephone Encounter (Signed)
Last OV 06/23/20 last refill 07/28/20 for refill on 10 mg Adderall.

## 2020-11-01 NOTE — Telephone Encounter (Signed)
Noted Agree with urgent care evaluation advise

## 2020-11-01 NOTE — Telephone Encounter (Signed)
Call pt He missed his last appt with me and didn't reschedule He has a refill on adderal already [facility]   Please advise that I cannot refill adderal until he has a follow up appt with me

## 2020-11-01 NOTE — Telephone Encounter (Signed)
Pt needs a refill on amphetamine-dextroamphetamine (ADDERALL) 10 MG tablet sent to [facility]

## 2020-11-29 ENCOUNTER — Ambulatory Visit: Payer: BLUE CROSS/BLUE SHIELD | Admitting: Family

## 2020-12-08 ENCOUNTER — Encounter: Payer: Self-pay | Admitting: Family

## 2020-12-08 ENCOUNTER — Ambulatory Visit (INDEPENDENT_AMBULATORY_CARE_PROVIDER_SITE_OTHER): Payer: BLUE CROSS/BLUE SHIELD | Admitting: Family

## 2020-12-08 ENCOUNTER — Other Ambulatory Visit: Payer: Self-pay

## 2020-12-08 VITALS — BP 98/70 | HR 71 | Temp 98.2°F | Ht 72.0 in | Wt 141.2 lb

## 2020-12-08 DIAGNOSIS — R17 Unspecified jaundice: Secondary | ICD-10-CM

## 2020-12-08 DIAGNOSIS — F988 Other specified behavioral and emotional disorders with onset usually occurring in childhood and adolescence: Secondary | ICD-10-CM

## 2020-12-08 MED ORDER — AMPHETAMINE-DEXTROAMPHETAMINE 10 MG PO TABS: 10.0000 mg | ORAL_TABLET | Freq: Two times a day (BID) | ORAL | 0 refills | Status: DC

## 2020-12-08 NOTE — Assessment & Plan Note (Signed)
Chronic, stable.  Adderall 10 mg twice daily.  He does not take medication on weekends.I looked up patient on [place] Controlled Substances Reporting System PMP AWARE and saw no activity that raised concern of inappropriate use.  Refilled for 3 months, 3 separate prescriptions.  Advised he must have formal testing with psychology since done in childhood;referred to psychology. Follow-up in 3 months.

## 2020-12-08 NOTE — Progress Notes (Signed)
Subjective:    Patient ID: Russell Gutierrez, male    DOB: 07-21-1985, 35 y.o.   MRN: [id number]  CC: Russell Gutierrez is a 35 y.o. male who presents today for follow up.   HPI: Follow up ADHD  Feels well today.  No new complaints.  Recently moved into a new house and feeling more settled.  Anxiety improved as no longer living with his sister with two young children.   He had COVID last month without any residual symptoms.  He is compliant With Adderall 10mg  BID with improvement of concentration .no increased anxiety , cp or trouble sleeping.     He was to reschedule with [facility] psychology and happy to do that now.  HISTORY:  Past Medical History:  Diagnosis Date   Migraines    Past Surgical History:  Procedure Laterality Date   broken arm     age 54   Family History  Problem Relation Age of Onset   Arrhythmia Neg Hx     Allergies: Patient has no known allergies. No current outpatient medications on file prior to visit.   No current facility-administered medications on file prior to visit.    Social History   Tobacco Use   Smoking status: Former   Smokeless tobacco: Never  12 Use: Never used  Substance Use Topics   Alcohol use: Yes    Comment: glass of wine here or there.    Drug use: Never    Review of Systems  Constitutional:  Negative for chills and fever.  Respiratory:  Negative for cough.   Cardiovascular:  Negative for chest pain and palpitations.  Gastrointestinal:  Negative for nausea and vomiting.  Psychiatric/Behavioral:  Negative for sleep disturbance. The patient is not nervous/anxious.      Objective:    BP 98/70 (BP Location: Left Arm, Patient Position: Sitting, Cuff Size: Large)   Pulse 71   Temp 98.2 F (36.8 C) (Oral)   Ht 6' (1.829 m)   Wt 141 lb 3.2 oz (64 kg)   SpO2 99%   BMI 19.15 kg/m  BP Readings from Last 3 Encounters:  12/08/20 98/70  06/23/20 120/85  02/01/20 122/80   Wt Readings from Last 3  Encounters:  12/08/20 141 lb 3.2 oz (64 kg)  06/23/20 147 lb 3.2 oz (66.8 kg)  02/01/20 154 lb (69.9 kg)    Physical Exam Vitals reviewed.  Constitutional:      Appearance: He is well-developed.  Cardiovascular:     Rate and Rhythm: Regular rhythm.     Heart sounds: Normal heart sounds.  Pulmonary:     Effort: Pulmonary effort is normal. No respiratory distress.     Breath sounds: Normal breath sounds. No wheezing, rhonchi or rales.  Skin:    General: Skin is warm and dry.  Neurological:     Mental Status: He is alert.  Psychiatric:        Speech: Speech normal.        Behavior: Behavior normal.       Assessment & Plan:   Problem List Items Addressed This Visit       Other   Attention deficit disorder (ADD) in adult    Chronic, stable.  Adderall 10 mg twice daily.  He does not take medication on weekends.I looked up patient on [place] Controlled Substances Reporting System PMP AWARE and saw no activity that raised concern of inappropriate use.  Refilled for 3 months, 3 separate prescriptions.  Advised he must have formal testing with psychology since done in childhood;referred to psychology. Follow-up in 3 months.       Relevant Medications   amphetamine-dextroamphetamine (ADDERALL) 10 MG tablet   amphetamine-dextroamphetamine (ADDERALL) 10 MG tablet   amphetamine-dextroamphetamine (ADDERALL) 10 MG tablet   Elevated bilirubin - Primary   Relevant Orders   Bilirubin, fractionated(tot/dir/indir)     I am having Oran Rein. Kabel maintain his amphetamine-dextroamphetamine, amphetamine-dextroamphetamine, and amphetamine-dextroamphetamine.   Meds ordered this encounter  Medications   amphetamine-dextroamphetamine (ADDERALL) 10 MG tablet    Sig: Take 1 tablet (10 mg total) by mouth 2 (two) times daily.    Dispense:  60 tablet    Refill:  0    DNF 02/07/21    Order Specific Question:   Supervising Provider    Answer:   Sherlene Shams [[id number]]    amphetamine-dextroamphetamine (ADDERALL) 10 MG tablet    Sig: Take 1 tablet (10 mg total) by mouth 2 (two) times daily.    Dispense:  60 tablet    Refill:  0    DNF 01/08/21    Order Specific Question:   Supervising Provider    Answer:   Sherlene Shams [[id number]]   amphetamine-dextroamphetamine (ADDERALL) 10 MG tablet    Sig: Take 1 tablet (10 mg total) by mouth 2 (two) times daily.    Dispense:  60 tablet    Refill:  0    Order Specific Question:   Supervising Provider    Answer:   Sherlene Shams [[id number]]     Return precautions given.   Risks, benefits, and alternatives of the medications and treatment plan prescribed today were discussed, and patient expressed understanding.   Education regarding symptom management and diagnosis given to patient on AVS.  Continue to follow with Allegra Grana, FNP for routine health maintenance.   Russell Gutierrez and I agreed with plan.   [name], FNP

## 2020-12-09 LAB — BILIRUBIN, FRACTIONATED(TOT/DIR/INDIR)
Bilirubin, Direct: 0.5 mg/dL — ABNORMAL HIGH (ref 0.0–0.2)
Indirect Bilirubin: 2 mg/dL (calc) — ABNORMAL HIGH (ref 0.2–1.2)
Total Bilirubin: 2.5 mg/dL — ABNORMAL HIGH (ref 0.2–1.2)

## 2020-12-13 ENCOUNTER — Encounter: Payer: Self-pay | Admitting: Family

## 2021-03-12 ENCOUNTER — Ambulatory Visit: Payer: BLUE CROSS/BLUE SHIELD | Admitting: Family

## 2021-03-21 ENCOUNTER — Telehealth: Payer: Self-pay | Admitting: Family

## 2021-03-21 NOTE — Telephone Encounter (Signed)
FYI I called patient LM to call back. Not sure what to advise him bc he NO SHOWED last appointment & also the one in may. Pt was seen in August. The referral for ADHD testing was placed & closed 08/2019 due to how far out the testing was to be scheduled. Pt had stated to [facility] per referral notes that he could not wait from 08/2019-03-2020 to be tested. He is asking for refill until he is scheduled to see you in 05/08/21.

## 2021-03-21 NOTE — Telephone Encounter (Signed)
Pt called in requesting information on referral for psychology. Pt stated that NP Arnett was requesting him to see a psychologist for his add. Pt stated he didn't remember who NP Arnett referred him too. Looked in chart no referral was placed. Pt scheduled appointment with NP Arnett for 05/08/2021 at 10:30am. NP Arnett doesn't have any availability until 05/08/2020. Pt stated that he may run out of medication by then. Pt was wondering if NP Arnett can do a refill on medication (amphetamine-dextroamphetamine (ADDERALL) 10 MG tablet) until his future appt. Pt would like callback.

## 2021-03-22 ENCOUNTER — Other Ambulatory Visit: Payer: Self-pay | Admitting: Family

## 2021-03-22 DIAGNOSIS — F988 Other specified behavioral and emotional disorders with onset usually occurring in childhood and adolescence: Secondary | ICD-10-CM

## 2021-03-22 MED ORDER — AMPHETAMINE-DEXTROAMPHETAMINE 10 MG PO TABS: 10.0000 mg | ORAL_TABLET | Freq: Two times a day (BID) | ORAL | 0 refills | Status: DC

## 2021-03-22 NOTE — Telephone Encounter (Signed)
Call pt  Last appt within 4 months. F/u is scheduled first available 05/08/21.   Referral placed for formal evaluation of ADHD.    Refill provided of Adderall There is a refill for 02/07/21 at pharmacy which he has not filed so I only gave one refill for december which will get him through till January.   I looked up patient on [place] Controlled Substances Reporting System PMP AWARE and saw no activity that raised concern of inappropriate use.   

## 2021-03-22 NOTE — Telephone Encounter (Signed)
I called patient & he is already scheduled for formal testing. He was called this morning to get scheduled. He was advised that he MUST keep appointment for January 3rd to keep receiving refills.

## 2021-03-22 NOTE — Progress Notes (Unsigned)
Call pt  Last appt within 4 months. F/u is scheduled first available 05/08/21.   Referral placed for formal evaluation of ADHD.    Refill provided of Adderall There is a refill for 02/07/21 at pharmacy which he has not filed so I only gave one refill for december which will get him through till January.   I looked up patient on [place] Controlled Substances Reporting System PMP AWARE and saw no activity that raised concern of inappropriate use.

## 2021-05-08 ENCOUNTER — Encounter: Payer: Self-pay | Admitting: Family

## 2021-05-08 ENCOUNTER — Ambulatory Visit (INDEPENDENT_AMBULATORY_CARE_PROVIDER_SITE_OTHER): Payer: BLUE CROSS/BLUE SHIELD | Admitting: Family

## 2021-05-08 ENCOUNTER — Other Ambulatory Visit: Payer: Self-pay

## 2021-05-08 DIAGNOSIS — F988 Other specified behavioral and emotional disorders with onset usually occurring in childhood and adolescence: Secondary | ICD-10-CM | POA: Diagnosis not present

## 2021-05-08 MED ORDER — AMPHETAMINE-DEXTROAMPHETAMINE 10 MG PO TABS: 10.0000 mg | ORAL_TABLET | Freq: Two times a day (BID) | ORAL | 0 refills | Status: DC

## 2021-05-08 NOTE — Assessment & Plan Note (Addendum)
Chronic, stable. Refilled today. I looked up patient on [place] Controlled Substances Reporting System PMP AWARE and saw no activity that raised concern of inappropriate use. He is scheduled for formal testing.

## 2021-05-08 NOTE — Progress Notes (Signed)
Subjective:    Patient ID: Russell Gutierrez, male    DOB: 20-Aug-1985, 36 y.o.   MRN: [id number]  CC: BASILE CDEBACA is a 37 y.o. male who presents today for follow up.   HPI: Feels well today No new complaints   ADHD-compliant with Adderall 10 mg twice daily and feels working well for him. No increased anxiety, trouble sleeping, palpitations. Requests refill today.  Formal testing scheduled in March 2023.   HISTORY:  Past Medical History:  Diagnosis Date   Migraines    Past Surgical History:  Procedure Laterality Date   broken arm     age 63   Family History  Problem Relation Age of Onset   Arrhythmia Neg Hx     Allergies: Patient has no known allergies. No current outpatient medications on file prior to visit.   No current facility-administered medications on file prior to visit.    Social History   Tobacco Use   Smoking status: Former   Smokeless tobacco: Never  Scientific laboratory technician Use: Never used  Substance Use Topics   Alcohol use: Yes    Comment: glass of wine here or there.    Drug use: Never    Review of Systems  Constitutional:  Negative for chills and fever.  Respiratory:  Negative for cough.   Cardiovascular:  Negative for chest pain and palpitations.  Gastrointestinal:  Negative for nausea and vomiting.     Objective:    BP 122/80 (BP Location: Left Arm, Patient Position: Sitting, Cuff Size: Normal)    Pulse 64    Temp 97.7 F (36.5 C) (Oral)    Ht 6' (1.829 m)    Wt 150 lb 6.4 oz (68.2 kg)    SpO2 99%    BMI 20.40 kg/m  BP Readings from Last 3 Encounters:  05/08/21 122/80  12/08/20 98/70  06/23/20 120/85   Wt Readings from Last 3 Encounters:  05/08/21 150 lb 6.4 oz (68.2 kg)  12/08/20 141 lb 3.2 oz (64 kg)  06/23/20 147 lb 3.2 oz (66.8 kg)    Physical Exam Vitals reviewed.  Constitutional:      Appearance: He is well-developed.  Cardiovascular:     Rate and Rhythm: Regular rhythm.     Heart sounds: Normal heart sounds.   Pulmonary:     Effort: Pulmonary effort is normal. No respiratory distress.     Breath sounds: Normal breath sounds. No wheezing, rhonchi or rales.  Skin:    General: Skin is warm and dry.  Neurological:     Mental Status: He is alert.  Psychiatric:        Speech: Speech normal.        Behavior: Behavior normal.       Assessment & Plan:   Problem List Items Addressed This Visit       Other   Attention deficit disorder (ADD) in adult    Chronic, stable. Refilled today. I looked up patient on [place] Controlled Substances Reporting System PMP AWARE and saw no activity that raised concern of inappropriate use. He is scheduled for formal testing.       Relevant Medications   amphetamine-dextroamphetamine (ADDERALL) 10 MG tablet   amphetamine-dextroamphetamine (ADDERALL) 10 MG tablet   amphetamine-dextroamphetamine (ADDERALL) 10 MG tablet     I am having Mechele Dawley. Can maintain his amphetamine-dextroamphetamine, amphetamine-dextroamphetamine, and amphetamine-dextroamphetamine.   Meds ordered this encounter  Medications   amphetamine-dextroamphetamine (ADDERALL) 10 MG tablet    Sig: Take  1 tablet (10 mg total) by mouth 2 (two) times daily.    Dispense:  60 tablet    Refill:  0    DNF 07/06/21    Order Specific Question:   Supervising Provider    Answer:   Crecencio Mc [[id number]]   amphetamine-dextroamphetamine (ADDERALL) 10 MG tablet    Sig: Take 1 tablet (10 mg total) by mouth 2 (two) times daily.    Dispense:  60 tablet    Refill:  0    Order Specific Question:   Supervising Provider    Answer:   Crecencio Mc [[id number]]   amphetamine-dextroamphetamine (ADDERALL) 10 MG tablet    Sig: Take 1 tablet (10 mg total) by mouth 2 (two) times daily.    Dispense:  60 tablet    Refill:  0    DNF 06/08/21    Order Specific Question:   Supervising Provider    Answer:   Crecencio Mc [[id number]]    Return precautions given.   Risks, benefits, and alternatives of the medications and  treatment plan prescribed today were discussed, and patient expressed understanding.   Education regarding symptom management and diagnosis given to patient on AVS.  Continue to follow with Burnard Hawthorne, FNP for routine health maintenance.   Russell Gutierrez and I agreed with plan.   Mable Paris, FNP

## 2021-06-20 ENCOUNTER — Telehealth: Payer: Self-pay | Admitting: Family

## 2021-06-20 NOTE — Telephone Encounter (Signed)
Refill on amphetamine-dextroamphetamine (ADDERALL) 10 MG tablet. Send to [facility] [address]

## 2021-06-20 NOTE — Telephone Encounter (Signed)
Pt was seen on 05/08/21 it appears the dates are all the same on scripts though? Can you resend or am I seeing something wrong?

## 2021-06-20 NOTE — Telephone Encounter (Signed)
I was wrong I see the DNF dates. Called LM for him to call pharmacy.

## 2021-06-20 NOTE — Telephone Encounter (Signed)
noted 

## 2021-08-09 NOTE — Telephone Encounter (Signed)
Pt wife called in stating that the pharmacy that NP Arnett sent medication (amphetamine-dextroamphetamine (ADDERALL) 10 MG tablet) to is on back order... Pt wife stated that they will be leaving for vacation tomorrow if its possible to send [facility] [address] in [place] [address]... Pt wife requesting callback  ?

## 2021-08-13 ENCOUNTER — Telehealth: Payer: Self-pay

## 2021-08-13 NOTE — Telephone Encounter (Signed)
LMTCB office to discuss patient message ?

## 2021-08-13 NOTE — Telephone Encounter (Signed)
LMTCB office to discuss patient message ?

## 2021-08-16 NOTE — Telephone Encounter (Signed)
Left message to call office unfortunately control substance scripts cannot be prescribed or transferred across state lines. Please advise patient if pharmacy does not have his medication here we can prescribe to another local pharmacy. ?

## 2021-08-20 ENCOUNTER — Telehealth: Payer: Self-pay

## 2021-08-20 NOTE — Telephone Encounter (Signed)
LMTCB to office to discuss medication ?

## 2021-08-20 NOTE — Telephone Encounter (Signed)
Spoke to patient and he stated that they were back in town [facility] were [facility]. Patient stated he would get back in  contact with [place] if he needed to. ?

## 2021-08-20 NOTE — Telephone Encounter (Signed)
Pt returning call. Pt requesting callback.  °

## 2021-08-22 NOTE — Telephone Encounter (Signed)
Spoke to patient and they were on vacation at the time and he stated that he understood about not being able to get medication in another state ?

## 2021-09-05 DIAGNOSIS — M9902 Segmental and somatic dysfunction of thoracic region: Secondary | ICD-10-CM | POA: Diagnosis not present

## 2021-09-05 DIAGNOSIS — D2261 Melanocytic nevi of right upper limb, including shoulder: Secondary | ICD-10-CM | POA: Diagnosis not present

## 2021-09-05 DIAGNOSIS — M9901 Segmental and somatic dysfunction of cervical region: Secondary | ICD-10-CM | POA: Diagnosis not present

## 2021-09-05 DIAGNOSIS — D2262 Melanocytic nevi of left upper limb, including shoulder: Secondary | ICD-10-CM | POA: Diagnosis not present

## 2021-09-05 DIAGNOSIS — M542 Cervicalgia: Secondary | ICD-10-CM | POA: Diagnosis not present

## 2021-09-05 DIAGNOSIS — D2272 Melanocytic nevi of left lower limb, including hip: Secondary | ICD-10-CM | POA: Diagnosis not present

## 2021-09-05 DIAGNOSIS — D225 Melanocytic nevi of trunk: Secondary | ICD-10-CM | POA: Diagnosis not present

## 2021-09-05 DIAGNOSIS — M546 Pain in thoracic spine: Secondary | ICD-10-CM | POA: Diagnosis not present

## 2021-09-08 IMAGING — US US ABDOMEN LIMITED
1 series · 14 of 25 positions shown · non-contrast
Comparison: Ultrasound abdomen 02/26/2018.

CLINICAL DATA: Elevated liver function tests, gallbladder polyp.

EXAM:
ULTRASOUND ABDOMEN LIMITED RIGHT UPPER QUADRANT

[Series 1: us abdomen limited ruq (liver/gb) · 14 of 38 slices shown]
[im 1/38]
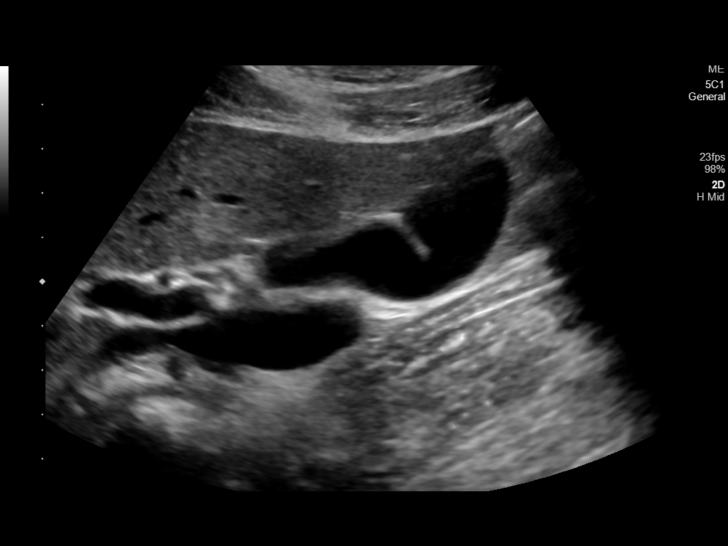
[im 4/38]
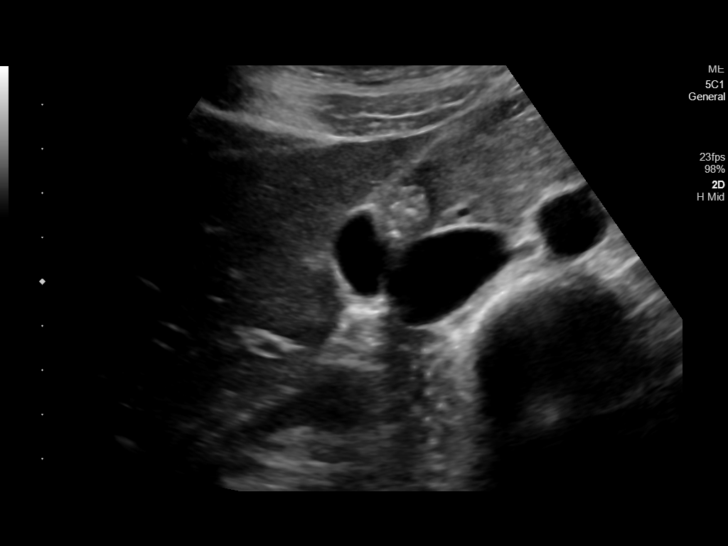
[im 7/38]
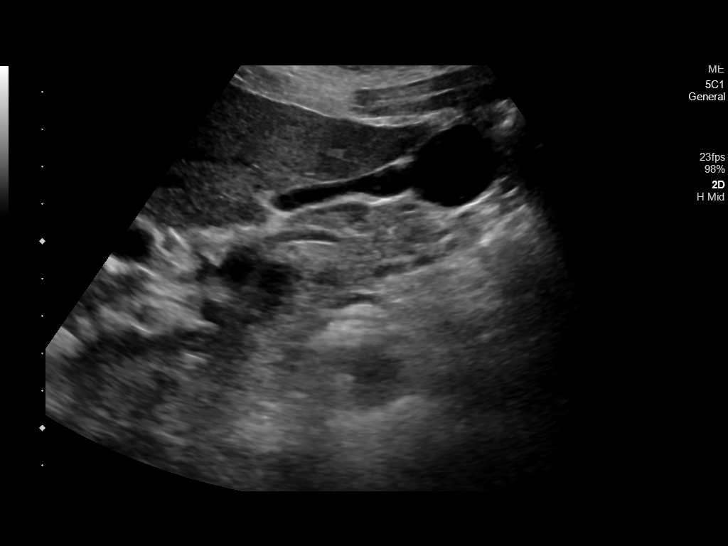
[im 10/38]
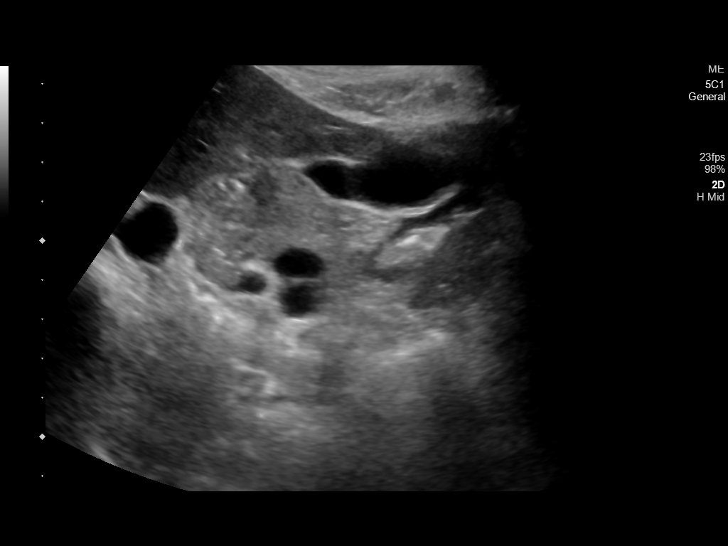
[im 13/38]
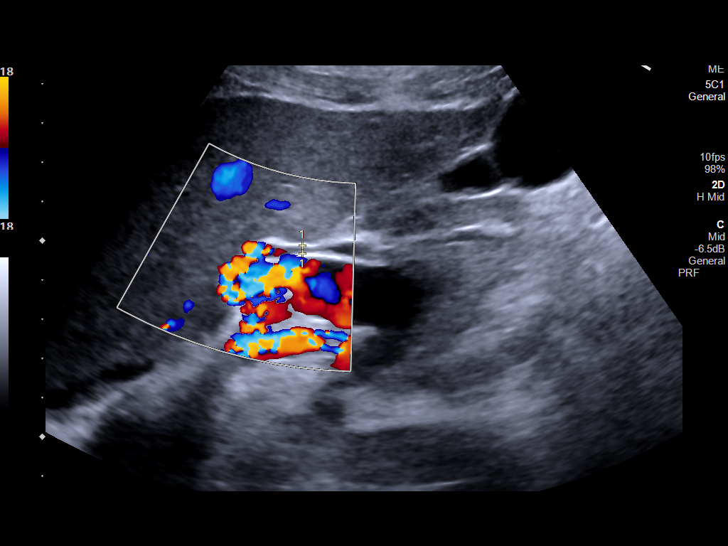
[im 14/38]
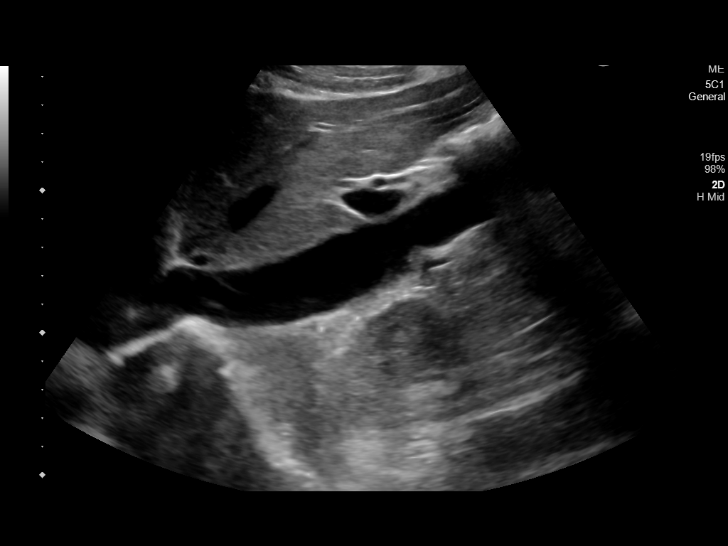
[im 17/38]
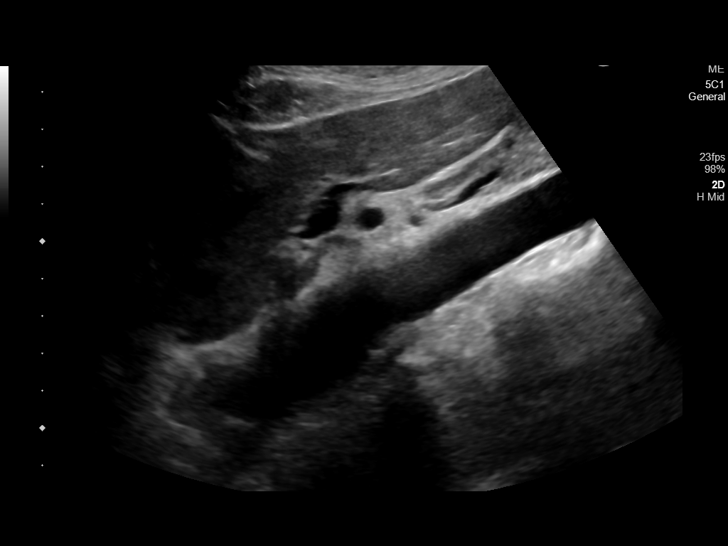
[im 21/38]
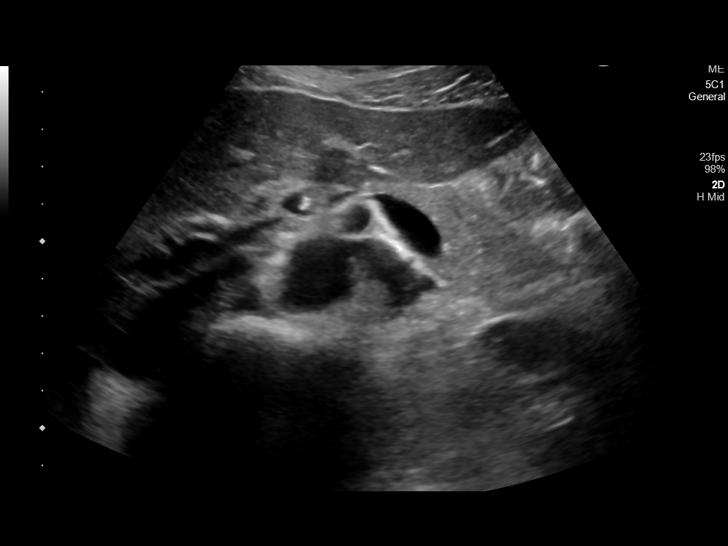
[im 24/38]
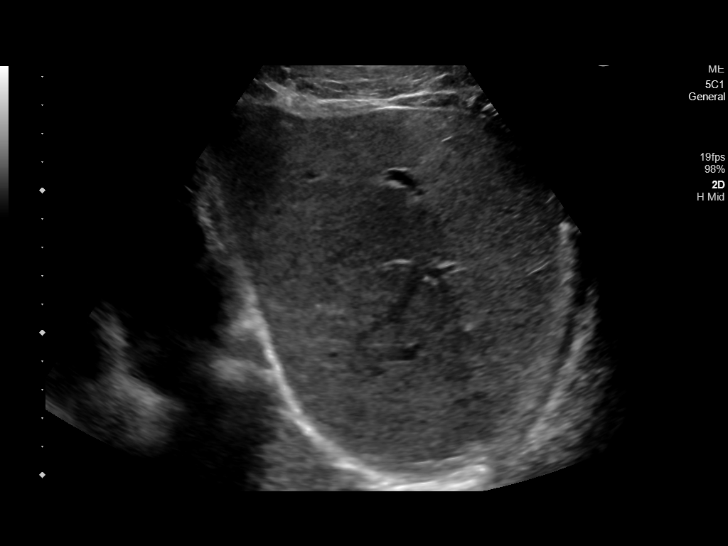
[im 25/38]
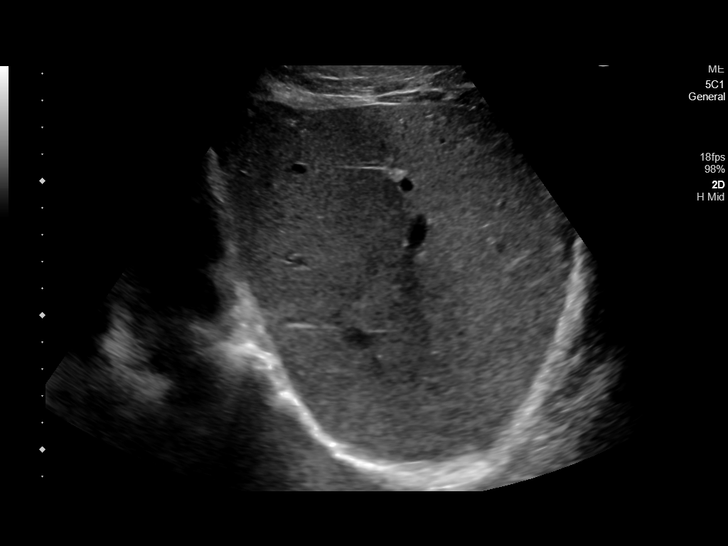
[im 28/38]
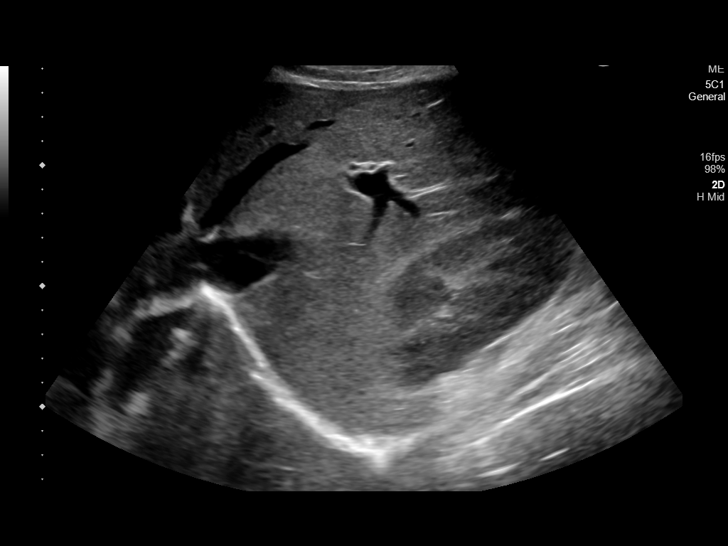
[im 31/38]
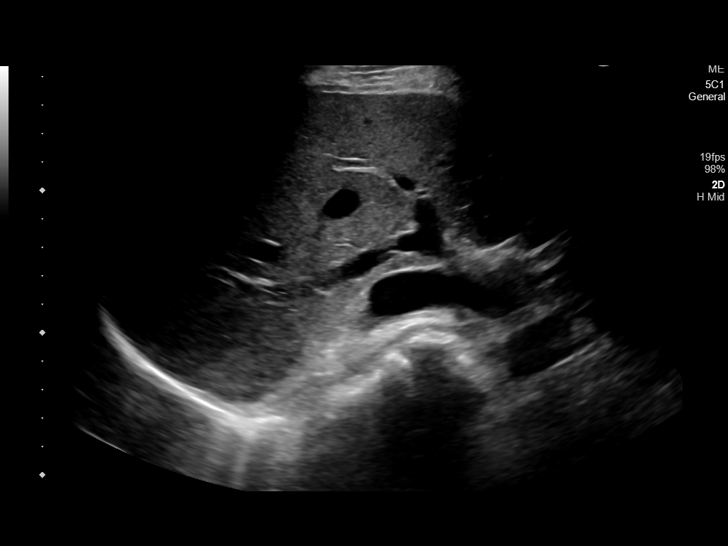
[im 34/38]
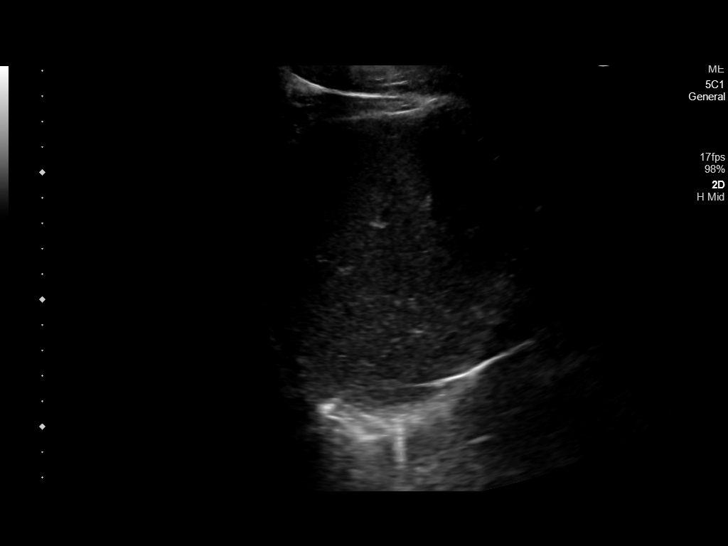
[im 38/38]
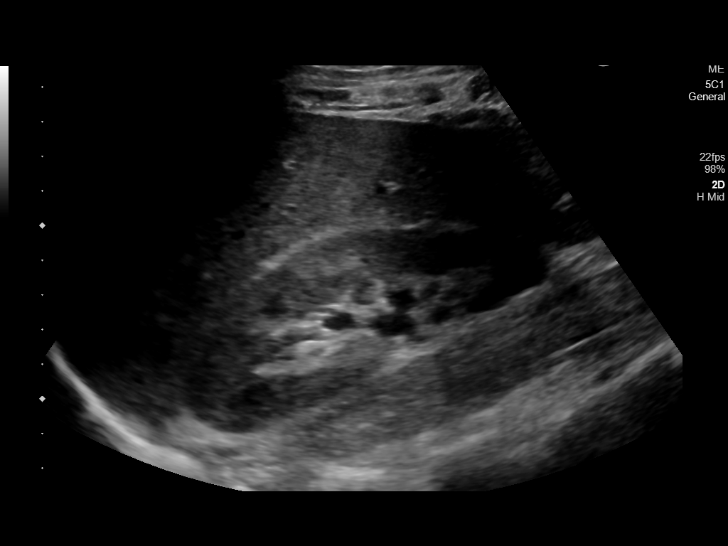

[14 of 25 positions shown; findings below may reference images not displayed]

FINDINGS: Gallbladder:

No gallstones or wall thickening visualized. Previously visualized
gallbladder polyp no longer identified. No sonographic Murphy sign
noted by sonographer.

Common bile duct:

Diameter: 2 mm.

Liver:

No focal lesion identified. Within normal limits in parenchymal
echogenicity. Portal vein is patent on color Doppler imaging with
normal direction of blood flow towards the liver.

Other: None.
IMPRESSION: 1. Unremarkable right upper quadrant ultrasound.
2. Previously identified gallbladder polyp no longer visualized. No
follow-up indicated

## 2021-09-19 DIAGNOSIS — M542 Cervicalgia: Secondary | ICD-10-CM | POA: Diagnosis not present

## 2021-09-19 DIAGNOSIS — M9902 Segmental and somatic dysfunction of thoracic region: Secondary | ICD-10-CM | POA: Diagnosis not present

## 2021-09-19 DIAGNOSIS — M9901 Segmental and somatic dysfunction of cervical region: Secondary | ICD-10-CM | POA: Diagnosis not present

## 2021-09-19 DIAGNOSIS — M546 Pain in thoracic spine: Secondary | ICD-10-CM | POA: Diagnosis not present

## 2021-10-03 DIAGNOSIS — M546 Pain in thoracic spine: Secondary | ICD-10-CM | POA: Diagnosis not present

## 2021-10-03 DIAGNOSIS — M9902 Segmental and somatic dysfunction of thoracic region: Secondary | ICD-10-CM | POA: Diagnosis not present

## 2021-10-03 DIAGNOSIS — M9901 Segmental and somatic dysfunction of cervical region: Secondary | ICD-10-CM | POA: Diagnosis not present

## 2021-10-03 DIAGNOSIS — M542 Cervicalgia: Secondary | ICD-10-CM | POA: Diagnosis not present

## 2021-11-16 DIAGNOSIS — R5383 Other fatigue: Secondary | ICD-10-CM | POA: Diagnosis not present

## 2021-11-16 DIAGNOSIS — G4709 Other insomnia: Secondary | ICD-10-CM | POA: Diagnosis not present

## 2021-11-16 DIAGNOSIS — F900 Attention-deficit hyperactivity disorder, predominantly inattentive type: Secondary | ICD-10-CM | POA: Diagnosis not present

## 2021-11-28 DIAGNOSIS — M9902 Segmental and somatic dysfunction of thoracic region: Secondary | ICD-10-CM | POA: Diagnosis not present

## 2021-11-28 DIAGNOSIS — M546 Pain in thoracic spine: Secondary | ICD-10-CM | POA: Diagnosis not present

## 2021-11-28 DIAGNOSIS — M9901 Segmental and somatic dysfunction of cervical region: Secondary | ICD-10-CM | POA: Diagnosis not present

## 2021-11-28 DIAGNOSIS — M542 Cervicalgia: Secondary | ICD-10-CM | POA: Diagnosis not present

## 2021-12-12 DIAGNOSIS — M542 Cervicalgia: Secondary | ICD-10-CM | POA: Diagnosis not present

## 2021-12-12 DIAGNOSIS — M9905 Segmental and somatic dysfunction of pelvic region: Secondary | ICD-10-CM | POA: Diagnosis not present

## 2021-12-12 DIAGNOSIS — M25651 Stiffness of right hip, not elsewhere classified: Secondary | ICD-10-CM | POA: Diagnosis not present

## 2021-12-12 DIAGNOSIS — M9901 Segmental and somatic dysfunction of cervical region: Secondary | ICD-10-CM | POA: Diagnosis not present

## 2022-01-04 DIAGNOSIS — R5383 Other fatigue: Secondary | ICD-10-CM | POA: Diagnosis not present

## 2022-01-04 DIAGNOSIS — F900 Attention-deficit hyperactivity disorder, predominantly inattentive type: Secondary | ICD-10-CM | POA: Diagnosis not present

## 2022-01-04 DIAGNOSIS — G4709 Other insomnia: Secondary | ICD-10-CM | POA: Diagnosis not present

## 2022-01-23 ENCOUNTER — Ambulatory Visit (INDEPENDENT_AMBULATORY_CARE_PROVIDER_SITE_OTHER): Payer: BLUE CROSS/BLUE SHIELD | Admitting: Family

## 2022-01-23 ENCOUNTER — Encounter: Payer: Self-pay | Admitting: Family

## 2022-01-23 DIAGNOSIS — F988 Other specified behavioral and emotional disorders with onset usually occurring in childhood and adolescence: Secondary | ICD-10-CM | POA: Diagnosis not present

## 2022-01-23 DIAGNOSIS — F909 Attention-deficit hyperactivity disorder, unspecified type: Secondary | ICD-10-CM

## 2022-01-23 MED ORDER — AMPHETAMINE-DEXTROAMPHETAMINE 10 MG PO TABS: 10.0000 mg | ORAL_TABLET | Freq: Two times a day (BID) | ORAL | 0 refills | Status: DC

## 2022-01-23 NOTE — Assessment & Plan Note (Addendum)
Chronic, stable.  He has been tested by counselor as a relates to ADHD.  He is awaiting formal documentation from this he will share with me.  I have refilled adderall 10mg .  I looked up patient on [place] Controlled Substances Reporting System PMP AWARE and saw no activity that raised concern of inappropriate use.

## 2022-01-23 NOTE — Patient Instructions (Signed)
Nice to see you today.  Please send the report from counselor as a relates to ADHD testing when you can. I have sent in 71-month supply of Adderall

## 2022-01-23 NOTE — Progress Notes (Signed)
Subjective:    Patient ID: Russell Gutierrez, male    DOB: 08/16/1985, 36 y.o.   MRN: [id number]  CC: Russell Gutierrez is a 36 y.o. male who presents today for follow up.   HPI: Feels well today.  No complaints or concerns.  He is doing well on Adderall 10 mg although has been spacing prescription out since his last refill.   No increased anxiety or trouble sleeping on medication.    Adderall is helpful at home and at work.    He was recently formally evaluated ADHD with a counselor whom he sees.      HISTORY:  Past Medical History:  Diagnosis Date   Migraines    Past Surgical History:  Procedure Laterality Date   broken arm     age 72   Family History  Problem Relation Age of Onset   Arrhythmia Neg Hx     Allergies: Patient has no known allergies. No current outpatient medications on file prior to visit.   No current facility-administered medications on file prior to visit.    Social History   Tobacco Use   Smoking status: Former   Smokeless tobacco: Never  Scientific laboratory technician Use: Never used  Substance Use Topics   Alcohol use: Yes    Comment: glass of wine here or there.    Drug use: Never    Review of Systems  Constitutional:  Negative for chills and fever.  Respiratory:  Negative for cough.   Cardiovascular:  Negative for chest pain and palpitations.  Gastrointestinal:  Negative for nausea and vomiting.      Objective:    BP 122/84 (BP Location: Left Arm, Patient Position: Sitting, Cuff Size: Normal)   Pulse (!) 5   Temp 98.5 F (36.9 C) (Oral)   Ht 6' (1.829 m)   Wt 147 lb 12.8 oz (67 kg)   SpO2 98%   BMI 20.05 kg/m  BP Readings from Last 3 Encounters:  01/23/22 122/84  05/08/21 122/80  12/08/20 98/70   Wt Readings from Last 3 Encounters:  01/23/22 147 lb 12.8 oz (67 kg)  05/08/21 150 lb 6.4 oz (68.2 kg)  12/08/20 141 lb 3.2 oz (64 kg)    Physical Exam Vitals reviewed.  Constitutional:      Appearance: He is well-developed.   Cardiovascular:     Rate and Rhythm: Regular rhythm.     Heart sounds: Normal heart sounds.  Pulmonary:     Effort: Pulmonary effort is normal. No respiratory distress.     Breath sounds: Normal breath sounds. No wheezing, rhonchi or rales.  Skin:    General: Skin is warm and dry.  Neurological:     Mental Status: He is alert.  Psychiatric:        Speech: Speech normal.        Behavior: Behavior normal.        Assessment & Plan:   Problem List Items Addressed This Visit       Other   ADHD (attention deficit hyperactivity disorder)    Chronic, stable.  He has been tested by counselor as a relates to ADHD.  He is awaiting formal documentation from this he will share with me.  I have refilled adderall 10mg .  I looked up patient on [place] Controlled Substances Reporting System PMP AWARE and saw no activity that raised concern of inappropriate use.        Attention deficit disorder (ADD) in adult   Relevant  Medications   amphetamine-dextroamphetamine (ADDERALL) 10 MG tablet   amphetamine-dextroamphetamine (ADDERALL) 10 MG tablet   amphetamine-dextroamphetamine (ADDERALL) 10 MG tablet     I am having Russell Dawley. [place] maintain his amphetamine-dextroamphetamine, amphetamine-dextroamphetamine, and amphetamine-dextroamphetamine.   Meds ordered this encounter  Medications   DISCONTD: amphetamine-dextroamphetamine (ADDERALL) 10 MG tablet    Sig: Take 1 tablet (10 mg total) by mouth 2 (two) times daily.    Dispense:  60 tablet    Refill:  0    DNF 03/25/2022    Order Specific Question:   Supervising Provider    Answer:   Crecencio Mc [[id number]]   DISCONTD: amphetamine-dextroamphetamine (ADDERALL) 10 MG tablet    Sig: Take 1 tablet (10 mg total) by mouth 2 (two) times daily.    Dispense:  60 tablet    Refill:  0    DNF 02/22/22    Order Specific Question:   Supervising Provider    Answer:   Crecencio Mc [[id number]]   DISCONTD: amphetamine-dextroamphetamine (ADDERALL) 10 MG  tablet    Sig: Take 1 tablet (10 mg total) by mouth 2 (two) times daily.    Dispense:  60 tablet    Refill:  0    Order Specific Question:   Supervising Provider    Answer:   Crecencio Mc [[id number]]   amphetamine-dextroamphetamine (ADDERALL) 10 MG tablet    Sig: Take 1 tablet (10 mg total) by mouth 2 (two) times daily.    Dispense:  60 tablet    Refill:  0    DNF 02/22/22    Order Specific Question:   Supervising Provider    Answer:   Crecencio Mc [[id number]]   amphetamine-dextroamphetamine (ADDERALL) 10 MG tablet    Sig: Take 1 tablet (10 mg total) by mouth 2 (two) times daily.    Dispense:  60 tablet    Refill:  0    DNF 03/25/2022    Order Specific Question:   Supervising Provider    Answer:   Crecencio Mc [[id number]]   amphetamine-dextroamphetamine (ADDERALL) 10 MG tablet    Sig: Take 1 tablet (10 mg total) by mouth 2 (two) times daily.    Dispense:  60 tablet    Refill:  0    Order Specific Question:   Supervising Provider    Answer:   Crecencio Mc [[id number]]    Return precautions given.   Risks, benefits, and alternatives of the medications and treatment plan prescribed today were discussed, and patient expressed understanding.   Education regarding symptom management and diagnosis given to patient on AVS.  Continue to follow with Burnard Hawthorne, FNP for routine health maintenance.   Russell Gutierrez and I agreed with plan.   Mable Paris, FNP

## 2022-02-13 DIAGNOSIS — M542 Cervicalgia: Secondary | ICD-10-CM | POA: Diagnosis not present

## 2022-02-13 DIAGNOSIS — M9902 Segmental and somatic dysfunction of thoracic region: Secondary | ICD-10-CM | POA: Diagnosis not present

## 2022-02-13 DIAGNOSIS — M9901 Segmental and somatic dysfunction of cervical region: Secondary | ICD-10-CM | POA: Diagnosis not present

## 2022-02-13 DIAGNOSIS — M546 Pain in thoracic spine: Secondary | ICD-10-CM | POA: Diagnosis not present

## 2022-03-04 DIAGNOSIS — M9901 Segmental and somatic dysfunction of cervical region: Secondary | ICD-10-CM | POA: Diagnosis not present

## 2022-03-04 DIAGNOSIS — M9902 Segmental and somatic dysfunction of thoracic region: Secondary | ICD-10-CM | POA: Diagnosis not present

## 2022-03-04 DIAGNOSIS — M542 Cervicalgia: Secondary | ICD-10-CM | POA: Diagnosis not present

## 2022-03-04 DIAGNOSIS — M546 Pain in thoracic spine: Secondary | ICD-10-CM | POA: Diagnosis not present

## 2022-03-13 DIAGNOSIS — M542 Cervicalgia: Secondary | ICD-10-CM | POA: Diagnosis not present

## 2022-03-13 DIAGNOSIS — M9901 Segmental and somatic dysfunction of cervical region: Secondary | ICD-10-CM | POA: Diagnosis not present

## 2022-03-13 DIAGNOSIS — M546 Pain in thoracic spine: Secondary | ICD-10-CM | POA: Diagnosis not present

## 2022-03-13 DIAGNOSIS — M9902 Segmental and somatic dysfunction of thoracic region: Secondary | ICD-10-CM | POA: Diagnosis not present

## 2022-03-15 DIAGNOSIS — M542 Cervicalgia: Secondary | ICD-10-CM | POA: Diagnosis not present

## 2022-03-15 DIAGNOSIS — M9901 Segmental and somatic dysfunction of cervical region: Secondary | ICD-10-CM | POA: Diagnosis not present

## 2022-03-15 DIAGNOSIS — M546 Pain in thoracic spine: Secondary | ICD-10-CM | POA: Diagnosis not present

## 2022-03-15 DIAGNOSIS — M9902 Segmental and somatic dysfunction of thoracic region: Secondary | ICD-10-CM | POA: Diagnosis not present

## 2022-03-20 DIAGNOSIS — M542 Cervicalgia: Secondary | ICD-10-CM | POA: Diagnosis not present

## 2022-03-20 DIAGNOSIS — M9902 Segmental and somatic dysfunction of thoracic region: Secondary | ICD-10-CM | POA: Diagnosis not present

## 2022-03-20 DIAGNOSIS — M546 Pain in thoracic spine: Secondary | ICD-10-CM | POA: Diagnosis not present

## 2022-03-20 DIAGNOSIS — M9901 Segmental and somatic dysfunction of cervical region: Secondary | ICD-10-CM | POA: Diagnosis not present

## 2022-03-27 DIAGNOSIS — M546 Pain in thoracic spine: Secondary | ICD-10-CM | POA: Diagnosis not present

## 2022-03-27 DIAGNOSIS — M542 Cervicalgia: Secondary | ICD-10-CM | POA: Diagnosis not present

## 2022-03-27 DIAGNOSIS — M9901 Segmental and somatic dysfunction of cervical region: Secondary | ICD-10-CM | POA: Diagnosis not present

## 2022-03-27 DIAGNOSIS — M9902 Segmental and somatic dysfunction of thoracic region: Secondary | ICD-10-CM | POA: Diagnosis not present

## 2022-04-03 DIAGNOSIS — M542 Cervicalgia: Secondary | ICD-10-CM | POA: Diagnosis not present

## 2022-04-03 DIAGNOSIS — M9901 Segmental and somatic dysfunction of cervical region: Secondary | ICD-10-CM | POA: Diagnosis not present

## 2022-04-03 DIAGNOSIS — M546 Pain in thoracic spine: Secondary | ICD-10-CM | POA: Diagnosis not present

## 2022-04-03 DIAGNOSIS — M9902 Segmental and somatic dysfunction of thoracic region: Secondary | ICD-10-CM | POA: Diagnosis not present

## 2022-04-24 ENCOUNTER — Telehealth: Payer: Self-pay

## 2022-04-24 ENCOUNTER — Encounter: Payer: BLUE CROSS/BLUE SHIELD | Admitting: Family

## 2022-04-24 NOTE — Telephone Encounter (Signed)
Tried calling pt again to begin his MCVV @ 4pm today 04/24/22  but had to LVM to call back to office

## 2022-04-24 NOTE — Progress Notes (Signed)
Virtual Visit via Video Note  I connected with Russell Gutierrez on 04/24/22 at  4:00 PM EST by a video enabled telemedicine application and verified that I am speaking with the correct person using two identifiers. Location patient: home Location provider: work  Persons participating in the virtual visit: patient, provider  I discussed the limitations of evaluation and management by telemedicine and the availability of in person appointments. The patient expressed understanding and agreed to proceed.  HPI: Follow up ADHD   ROS: See pertinent positives and negatives per HPI.  EXAM:  VITALS per patient if applicable: There were no vitals taken for this visit. BP Readings from Last 3 Encounters:  01/23/22 122/84  05/08/21 122/80  12/08/20 98/70   Wt Readings from Last 3 Encounters:  01/23/22 147 lb 12.8 oz (67 kg)  05/08/21 150 lb 6.4 oz (68.2 kg)  12/08/20 141 lb 3.2 oz (64 kg)    GENERAL: alert, oriented, appears well and in no acute distress  HEENT: atraumatic, conjunttiva clear, no obvious abnormalities on inspection of external nose and ears  NECK: normal movements of the head and neck  LUNGS: on inspection no signs of respiratory distress, breathing rate appears normal, no obvious gross SOB, gasping or wheezing  CV: no obvious cyanosis  MS: moves all visible extremities without noticeable abnormality  PSYCH/NEURO: pleasant and cooperative, no obvious depression or anxiety, speech and thought processing grossly intact  ASSESSMENT AND PLAN: Erroneous encounter - disregard     -we discussed possible serious and likely etiologies, options for evaluation and workup, limitations of telemedicine visit vs in person visit, treatment, treatment risks and precautions. Pt prefers to treat via telemedicine empirically rather then risking or undertaking an in person visit at this moment.    I discussed the assessment and treatment plan with the patient. The patient was  provided an opportunity to ask questions and all were answered. The patient agreed with the plan and demonstrated an understanding of the instructions.   The patient was advised to call back or seek an in-person evaluation if the symptoms worsen or if the condition fails to improve as anticipated.  Advised if desired AVS can be mailed or viewed via MyChart if Mychart user.   Russell Plowman, FNP  This encounter was created in error - please disregard.

## 2022-04-24 NOTE — Telephone Encounter (Signed)
LVM to call back to get pt ready for MCVV @ 4PM TODAY.

## 2022-07-15 ENCOUNTER — Encounter: Payer: Self-pay | Admitting: Family

## 2022-07-15 ENCOUNTER — Ambulatory Visit (INDEPENDENT_AMBULATORY_CARE_PROVIDER_SITE_OTHER): Payer: BLUE CROSS/BLUE SHIELD | Admitting: Family

## 2022-07-15 VITALS — BP 122/80 | HR 74 | Temp 98.0°F | Ht 72.0 in | Wt 155.2 lb

## 2022-07-15 DIAGNOSIS — F909 Attention-deficit hyperactivity disorder, unspecified type: Secondary | ICD-10-CM | POA: Diagnosis not present

## 2022-07-15 DIAGNOSIS — F419 Anxiety disorder, unspecified: Secondary | ICD-10-CM | POA: Diagnosis not present

## 2022-07-15 DIAGNOSIS — F32A Depression, unspecified: Secondary | ICD-10-CM | POA: Diagnosis not present

## 2022-07-15 DIAGNOSIS — F988 Other specified behavioral and emotional disorders with onset usually occurring in childhood and adolescence: Secondary | ICD-10-CM

## 2022-07-15 MED ORDER — AMPHETAMINE-DEXTROAMPHETAMINE 10 MG PO TABS: 10.0000 mg | ORAL_TABLET | Freq: Two times a day (BID) | ORAL | 0 refills | Status: DC

## 2022-07-15 NOTE — Progress Notes (Signed)
Assessment & Plan:  Attention deficit hyperactivity disorder (ADHD), unspecified ADHD type Assessment & Plan: Chronic, stable.   I have refilled adderall '10mg'$  BID.  I looked up patient on [place] Controlled Substances Reporting System PMP AWARE and saw no activity that raised concern of inappropriate use.     Attention deficit disorder (ADD) in adult -     Amphetamine-Dextroamphetamine; Take 1 tablet (10 mg total) by mouth 2 (two) times daily.  Dispense: 60 tablet; Refill: 0 -     Amphetamine-Dextroamphetamine; Take 1 tablet (10 mg total) by mouth 2 (two) times daily.  Dispense: 60 tablet; Refill: 0 -     Amphetamine-Dextroamphetamine; Take 1 tablet (10 mg total) by mouth 2 (two) times daily.  Dispense: 60 tablet; Refill: 0  Anxiety and depression Assessment & Plan: Discussed various options including daily such as lexapro or as needed including Atarax, trazodone.  We also discussed propranolol after he discussed trepidation with medication such as lexapro. He will consider these options as I described on his AVS and let me know.       Return precautions given.   Risks, benefits, and alternatives of the medications and treatment plan prescribed today were discussed, and patient expressed understanding.   Education regarding symptom management and diagnosis given to patient on AVS either electronically or printed.  Return in about 3 months (around 10/15/2022).  Russell Paris, Russell Gutierrez  Subjective:    Patient ID: Russell Gutierrez, male    DOB: 1985-06-22, 37 y.o.   MRN: [id number]  CC: Russell Gutierrez is a 37 y.o. male who presents today for follow up.   HPI: He describes increased stress and responsibilities with two young children, work and  potential move at home.  A couple of years ago he was prescribed Lexapro although never started.  He questions if there are days he has depression or anxiety.  Denies any thoughts of harming himself or anyone else.   He describes some nights that  it is difficult to fall asleep at night due to anxiety and "cannot turn his brain off".  He is overall pleased with Adderall 10 mg twice daily.  He does not think this medication contributes to anxiety    Allergies: Patient has no known allergies. No current outpatient medications on file prior to visit.   No current facility-administered medications on file prior to visit.    Review of Systems  Constitutional:  Negative for chills and fever.  Respiratory:  Negative for cough.   Cardiovascular:  Negative for chest pain and palpitations.  Gastrointestinal:  Negative for nausea and vomiting.  Psychiatric/Behavioral:  Positive for sleep disturbance. Negative for suicidal ideas. The patient is nervous/anxious.       Objective:    BP 122/80   Pulse 74   Temp 98 F (36.7 C) (Oral)   Ht 6' (1.829 m)   Wt 155 lb 3.2 oz (70.4 kg)   SpO2 99%   BMI 21.05 kg/m  BP Readings from Last 3 Encounters:  07/15/22 122/80  01/23/22 122/84  05/08/21 122/80   Wt Readings from Last 3 Encounters:  07/15/22 155 lb 3.2 oz (70.4 kg)  01/23/22 147 lb 12.8 oz (67 kg)  05/08/21 150 lb 6.4 oz (68.2 kg)    Physical Exam Vitals reviewed.  Constitutional:      Appearance: He is well-developed.  Cardiovascular:     Rate and Rhythm: Regular rhythm.     Heart sounds: Normal heart sounds.  Pulmonary:  Effort: Pulmonary effort is normal. No respiratory distress.     Breath sounds: Normal breath sounds. No wheezing, rhonchi or rales.  Skin:    General: Skin is warm and dry.  Neurological:     Mental Status: He is alert.  Psychiatric:        Speech: Speech normal.        Behavior: Behavior normal.

## 2022-07-15 NOTE — Assessment & Plan Note (Signed)
Chronic, stable.   I have refilled adderall '10mg'$  BID.  I looked up patient on [place] Controlled Substances Reporting System PMP AWARE and saw no activity that raised concern of inappropriate use.

## 2022-07-15 NOTE — Assessment & Plan Note (Signed)
Discussed various options including daily such as lexapro or as needed including Atarax, trazodone.  We also discussed propranolol after he discussed trepidation with medication such as lexapro. He will consider these options as I described on his AVS and let me know.

## 2022-07-15 NOTE — Patient Instructions (Addendum)
Options to consider  Lexapro was what Dr Caryl Bis started in the past.  This is an antidepressant that increases serotonin.  This would be a medication that you would not take as needed however if this is first-line therapy for depression, anxiety.  We could also consider  Propranolol daily ( not as needed) this is daily medication that is actually an old blood pressure medication that we will now use for anxiety and headache prevention  Atarax ( anxiety medication with safer profile than Klonopin) which can be used as needed during the day or at night to help with anxiety and/or in some  Trazodone ( can use daily or as needed, used for depression, insomnia however NOT for anxiety)    Please let me know how you are doing.

## 2022-08-21 ENCOUNTER — Other Ambulatory Visit: Payer: Self-pay | Admitting: Family

## 2022-08-21 ENCOUNTER — Encounter: Payer: Self-pay | Admitting: Family

## 2022-08-21 DIAGNOSIS — F419 Anxiety disorder, unspecified: Secondary | ICD-10-CM

## 2022-08-21 MED ORDER — ESCITALOPRAM OXALATE 5 MG PO TABS: 5.0000 mg | ORAL_TABLET | Freq: Every day | ORAL | 1 refills | Status: DC

## 2022-08-21 MED ORDER — HYDROXYZINE HCL 10 MG PO TABS
10.0000 mg | ORAL_TABLET | Freq: Two times a day (BID) | ORAL | 0 refills | Status: DC | PRN
Start: 2022-08-21 — End: 2022-10-04

## 2022-08-21 NOTE — Telephone Encounter (Signed)
Fyi  I sent mychart

## 2022-08-21 NOTE — Telephone Encounter (Signed)
LVM to call back to schedule appt to discuss possible new  medication

## 2022-08-27 ENCOUNTER — Telehealth: Payer: Self-pay | Admitting: Family

## 2022-08-27 DIAGNOSIS — F988 Other specified behavioral and emotional disorders with onset usually occurring in childhood and adolescence: Secondary | ICD-10-CM

## 2022-08-27 NOTE — Telephone Encounter (Signed)
Pt [facility] a [facility]ill [facility] 

## 2022-08-28 NOTE — Telephone Encounter (Signed)
Lvm to call back to schedule an in office  appt . Pt still has 2 refills at pharmacy

## 2022-08-28 NOTE — Telephone Encounter (Signed)
Please ask patient to call pharmacy first.  There are 2 remaining refills at the pharmacy   please ensure he has follow-up scheduled with me

## 2022-09-02 NOTE — Telephone Encounter (Signed)
LVM to call back in regards to refills

## 2022-09-03 NOTE — Telephone Encounter (Signed)
Pt has picked up medication.  

## 2022-09-11 DIAGNOSIS — M542 Cervicalgia: Secondary | ICD-10-CM | POA: Diagnosis not present

## 2022-09-11 DIAGNOSIS — M5134 Other intervertebral disc degeneration, thoracic region: Secondary | ICD-10-CM | POA: Diagnosis not present

## 2022-09-11 DIAGNOSIS — M9902 Segmental and somatic dysfunction of thoracic region: Secondary | ICD-10-CM | POA: Diagnosis not present

## 2022-09-11 DIAGNOSIS — M9901 Segmental and somatic dysfunction of cervical region: Secondary | ICD-10-CM | POA: Diagnosis not present

## 2022-09-18 DIAGNOSIS — M542 Cervicalgia: Secondary | ICD-10-CM | POA: Diagnosis not present

## 2022-09-18 DIAGNOSIS — M9902 Segmental and somatic dysfunction of thoracic region: Secondary | ICD-10-CM | POA: Diagnosis not present

## 2022-09-18 DIAGNOSIS — M9901 Segmental and somatic dysfunction of cervical region: Secondary | ICD-10-CM | POA: Diagnosis not present

## 2022-09-18 DIAGNOSIS — M546 Pain in thoracic spine: Secondary | ICD-10-CM | POA: Diagnosis not present

## 2022-09-25 DIAGNOSIS — M546 Pain in thoracic spine: Secondary | ICD-10-CM | POA: Diagnosis not present

## 2022-09-25 DIAGNOSIS — M9902 Segmental and somatic dysfunction of thoracic region: Secondary | ICD-10-CM | POA: Diagnosis not present

## 2022-09-25 DIAGNOSIS — M542 Cervicalgia: Secondary | ICD-10-CM | POA: Diagnosis not present

## 2022-09-25 DIAGNOSIS — M9901 Segmental and somatic dysfunction of cervical region: Secondary | ICD-10-CM | POA: Diagnosis not present

## 2022-10-02 ENCOUNTER — Other Ambulatory Visit: Payer: Self-pay | Admitting: Family

## 2022-10-02 DIAGNOSIS — F419 Anxiety disorder, unspecified: Secondary | ICD-10-CM

## 2022-10-15 ENCOUNTER — Ambulatory Visit: Payer: BLUE CROSS/BLUE SHIELD | Admitting: Family

## 2022-11-21 ENCOUNTER — Encounter: Payer: Self-pay | Admitting: Family

## 2022-11-21 ENCOUNTER — Telehealth: Payer: Self-pay | Admitting: Family

## 2022-11-21 DIAGNOSIS — F32A Depression, unspecified: Secondary | ICD-10-CM

## 2022-11-21 NOTE — Telephone Encounter (Signed)
Prescription Request  11/21/2022  LOV: 07/15/2022  What is the name of the medication or equipment? LEXAPRO   Have you contacted your pharmacy to request a refill? No   [facility] [address]  Patient notified that their request is being sent to the clinical staff for review and that they should receive a response within 2 business days.   Please advise at Mobile [phone] (mobile)

## 2022-11-22 MED ORDER — ESCITALOPRAM OXALATE 5 MG PO TABS
5.0000 mg | ORAL_TABLET | Freq: Every day | ORAL | 1 refills | Status: DC
Start: 2022-11-22 — End: 2023-02-26

## 2022-11-22 NOTE — Telephone Encounter (Signed)
LVM to inform pt that rx was sent in to the pharmacy

## 2022-12-04 ENCOUNTER — Ambulatory Visit (INDEPENDENT_AMBULATORY_CARE_PROVIDER_SITE_OTHER): Payer: BLUE CROSS/BLUE SHIELD | Admitting: Family

## 2022-12-04 VITALS — BP 118/74 | HR 74 | Temp 97.4°F | Ht 72.0 in | Wt 151.6 lb

## 2022-12-04 DIAGNOSIS — F988 Other specified behavioral and emotional disorders with onset usually occurring in childhood and adolescence: Secondary | ICD-10-CM | POA: Diagnosis not present

## 2022-12-04 DIAGNOSIS — F419 Anxiety disorder, unspecified: Secondary | ICD-10-CM | POA: Diagnosis not present

## 2022-12-04 DIAGNOSIS — F32A Depression, unspecified: Secondary | ICD-10-CM

## 2022-12-04 MED ORDER — TRAZODONE HCL 50 MG PO TABS
25.0000 mg | ORAL_TABLET | Freq: Every evening | ORAL | 3 refills | Status: DC | PRN
Start: 2022-12-04 — End: 2022-12-31

## 2022-12-04 MED ORDER — AMPHETAMINE-DEXTROAMPHETAMINE 10 MG PO TABS
10.0000 mg | ORAL_TABLET | Freq: Two times a day (BID) | ORAL | 0 refills | Status: AC
Start: 2022-12-04 — End: ?

## 2022-12-04 MED ORDER — AMPHETAMINE-DEXTROAMPHETAMINE 10 MG PO TABS: 10.0000 mg | ORAL_TABLET | Freq: Two times a day (BID) | ORAL | 0 refills | Status: AC

## 2022-12-04 NOTE — Progress Notes (Signed)
Assessment & Plan:  Anxiety and depression Assessment & Plan: Improved PHQ-9.  Trouble falling and staying asleep.  Trial of trazodone 25 to 50 mg nightly.  Continue Lexapro 5 mg.  Close follow-up  Orders: -     traZODone HCl; Take 0.5-1 tablets (25-50 mg total) by mouth at bedtime as needed for sleep.  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  Continue Adderall 10 mg BID. Refilled today. I looked up patient on [place] Controlled Substances Reporting System PMP AWARE and saw no activity that raised concern of inappropriate use. He is scheduled for formal testing.   Orders: -     Amphetamine-Dextroamphetamine; Take 1 tablet (10 mg total) by mouth 2 (two) times daily.  Dispense: 60 tablet; Refill: 0 -     Amphetamine-Dextroamphetamine; Take 1 tablet (10 mg total) by mouth 2 (two) times daily.  Dispense: 60 tablet; Refill: 0 -     Amphetamine-Dextroamphetamine; Take 1 tablet (10 mg total) by mouth 2 (two) times daily.  Dispense: 60 tablet; Refill: 0     Return precautions given.   Risks, benefits, and alternatives of the medications and treatment plan prescribed today were discussed, and patient expressed understanding.   Education regarding symptom management and diagnosis given to patient on AVS either electronically or printed.  Return in about 3 months (around 03/06/2023).  [name], Russell Gutierrez  Subjective:    Patient ID: Russell Gutierrez, male    DOB: [birth year], 37 y.o.   MRN: [id number]  CC: Russell Gutierrez is a 37 y.o. male who presents today for follow up.   HPI: He complains of fatigue and trouble sleeping.   he is sleeping approx 6 hours per night He feels a crash midday 'half the time'.   He has either trouble falling alseep or staying asleep.   He is exercising more often these 2-3 months than he has 2-3 years which has been helpful. He is running and riding the peloton most days of the week.    He is pleased with Lexapro 5 mg and feels it is working well for depression and anxiety         Allergies: Patient has no known allergies. Current Outpatient Medications on File Prior to Visit  Medication Sig Dispense Refill   escitalopram (LEXAPRO) 5 MG tablet Take 1 tablet (5 mg total) by mouth daily. 90 tablet 1   hydrOXYzine (ATARAX) 10 MG tablet TAKE 1 TABLET (10 MG TOTAL) BY MOUTH TWICE A DAY AS NEEDED FOR ANXIETY 180 tablet 0   No current facility-administered medications on file prior to visit.    Review of Systems  Constitutional:  Positive for fatigue. Negative for chills and fever.  Respiratory:  Negative for cough.   Cardiovascular:  Negative for chest pain and palpitations.  Gastrointestinal:  Negative for nausea and vomiting.  Psychiatric/Behavioral:  Positive for sleep disturbance. Negative for suicidal ideas. The patient is not nervous/anxious.       Objective:    BP 118/74   Pulse 74   Temp (!) 97.4 F (36.3 C) (Oral)   Ht 6' (1.829 m)   Wt 151 lb 9.6 oz (68.8 kg)   SpO2 96%   BMI 20.56 kg/m  BP Readings from Last 3 Encounters:  12/04/22 118/74  07/15/22 122/80  01/23/22 122/84   Wt Readings from Last 3 Encounters:  12/04/22 151 lb 9.6 oz (68.8 kg)  07/15/22 155 lb 3.2 oz (70.4 kg)  01/23/22 147 lb 12.8 oz (67 kg)      12/04/2022    3:37 PM 07/15/2022   10:30 AM 01/23/2022    2:42 PM  Depression screen PHQ 2/9  Decreased Interest 0 1 0  Down, Depressed, Hopeless 0 1 0  PHQ - 2 Score 0 2 0  Altered sleeping 2 0   Tired, decreased energy 1 2   Change in appetite 0 1   Feeling bad or failure about yourself  0 0   Trouble concentrating 0 0   Moving slowly or fidgety/restless 0 0   Suicidal thoughts 0 0   PHQ-9 Score 3 5   Difficult doing work/chores Somewhat difficult Somewhat difficult      Physical Exam Vitals reviewed.  Constitutional:      Appearance: He is well-developed.  Cardiovascular:     Rate and Rhythm: Regular rhythm.      Heart sounds: Normal heart sounds.  Pulmonary:     Effort: Pulmonary effort is normal. No respiratory distress.     Breath sounds: Normal breath sounds. No wheezing, rhonchi or rales.  Skin:    General: Skin is warm and dry.  Neurological:     Mental Status: He is alert.  Psychiatric:        Speech: Speech normal.        Behavior: Behavior normal.

## 2022-12-11 NOTE — Patient Instructions (Signed)
Start trazodone.  Please let me know how you are doing as it relates to fatigue and your sleep quality.  We can certainly increase trazodone

## 2022-12-11 NOTE — Assessment & Plan Note (Signed)
Improved PHQ-9.  Trouble falling and staying asleep.  Trial of trazodone 25 to 50 mg nightly.  Continue Lexapro 5 mg.  Close follow-up

## 2022-12-11 NOTE — Assessment & Plan Note (Addendum)
Chronic, stable.  Continue Adderall 10 mg BID. Refilled today. I looked up patient on [place] Controlled Substances Reporting System PMP AWARE and saw no activity that raised concern of inappropriate use. He is scheduled for formal testing.

## 2022-12-30 ENCOUNTER — Other Ambulatory Visit: Payer: Self-pay | Admitting: Family

## 2022-12-30 DIAGNOSIS — F419 Anxiety disorder, unspecified: Secondary | ICD-10-CM

## 2023-01-08 ENCOUNTER — Other Ambulatory Visit: Payer: Self-pay | Admitting: Family

## 2023-01-08 DIAGNOSIS — F419 Anxiety disorder, unspecified: Secondary | ICD-10-CM

## 2023-01-27 ENCOUNTER — Telehealth: Payer: Self-pay | Admitting: Family

## 2023-01-27 DIAGNOSIS — F988 Other specified behavioral and emotional disorders with onset usually occurring in childhood and adolescence: Secondary | ICD-10-CM

## 2023-01-27 NOTE — Telephone Encounter (Signed)
Prescription Request  01/27/2023  LOV: 12/04/2022  What is the name of the medication or equipment? ADDERALL   Have you contacted your pharmacy to request a refill? Yes   [facility] [address]   Patient notified that their request is being sent to the clinical staff for review and that they should receive a response within 2 business days.   Please advise at Mobile [phone] (mobile)

## 2023-01-28 ENCOUNTER — Encounter: Payer: Self-pay | Admitting: Family

## 2023-01-28 NOTE — Telephone Encounter (Signed)
Refill not due Sent mychart 01/28/23

## 2023-02-26 ENCOUNTER — Telehealth: Payer: Self-pay

## 2023-02-26 DIAGNOSIS — F32A Depression, unspecified: Secondary | ICD-10-CM

## 2023-02-26 MED ORDER — HYDROXYZINE HCL 10 MG PO TABS
10.0000 mg | ORAL_TABLET | Freq: Two times a day (BID) | ORAL | 1 refills | Status: AC | PRN
Start: 2023-02-26 — End: ?

## 2023-02-26 MED ORDER — ESCITALOPRAM OXALATE 5 MG PO TABS: 5.0000 mg | ORAL_TABLET | Freq: Every day | ORAL | 1 refills | Status: DC

## 2023-02-26 MED ORDER — TRAZODONE HCL 50 MG PO TABS: 25.0000 mg | ORAL_TABLET | Freq: Every evening | ORAL | 1 refills | Status: AC | PRN

## 2023-02-26 NOTE — Telephone Encounter (Signed)
LVM on  wife line to call back to inform her that:  [facility] was unable to find [facility].  [facility] at [address]  Please explain this to wife.  She may have to have it transferred.   I have given a 63-month supply of Lexapro, hydroxyzine, trazodone until patient can establish care with new primary.     I cannot refill the Adderall is a controlled substance unless patient has an in person follow-up with me

## 2023-02-26 NOTE — Telephone Encounter (Signed)
Pt wife called back and I read the note and she will pick up the meds

## 2023-02-26 NOTE — Telephone Encounter (Signed)
Call patient and wife I was unable to find that particular [facility].  I was able to find a [facility] located at [address]  Please explain this to wife.  She may have to have it transferred.  I have given a 52-month supply of Lexapro, hydroxyzine, trazodone until patient can establish care with new primary.    I cannot refill the Adderall is a controlled substance unless patient has an in person follow-up with me

## 2023-02-26 NOTE — Telephone Encounter (Signed)
Patient's wife, Donnovan Delashmit, called to state patient has moved out of state.  [place] states we sent his prescription for escitalopram (LEXAPRO) 5 MG tablet to [facility] [address] (not in [employer]), but patient needs to be able to pick it up from [facility] at [address] [place], [place] [address], phone:  [phone].  [place] states patient is out of this medication.  [place] states, if possible, patient would like to have all of his prescriptions sent to his new pharmacy (listed above).  [place] states patient has not yet had a chance to find a new PCP in [place].  [place] states she would like for [place] to please call and let her know if this is not possible.

## 2023-02-26 NOTE — Telephone Encounter (Signed)
noted 

## 2023-02-27 NOTE — Telephone Encounter (Signed)
Pt wife called about lexapro being transferred [facility] [address] [place] [address]

## 2023-02-28 MED ORDER — ESCITALOPRAM OXALATE 5 MG PO TABS: 5.0000 mg | ORAL_TABLET | Freq: Every day | ORAL | 1 refills | Status: AC

## 2023-02-28 NOTE — Telephone Encounter (Signed)
Spoke to pt wife [place] and explained to her that Lexapro has been sent to requested pharmacy and we received confirmation that they did receive it

## 2023-03-06 ENCOUNTER — Ambulatory Visit: Payer: BLUE CROSS/BLUE SHIELD | Admitting: Family

## 2023-09-23 ENCOUNTER — Encounter: Payer: Self-pay | Admitting: Family

## 2024-03-23 NOTE — Telephone Encounter (Signed)
 open in error
